# Patient Record
Sex: Female | Born: 1940 | Race: White | Hispanic: No | Marital: Married | Smoking: Never smoker
Health system: Southern US, Community
[De-identification: ages and names within clinical notes are randomized; demographics above are authoritative.]

## PROBLEM LIST (undated history)

## (undated) DIAGNOSIS — K219 Gastro-esophageal reflux disease without esophagitis: Secondary | ICD-10-CM

## (undated) DIAGNOSIS — M1990 Unspecified osteoarthritis, unspecified site: Secondary | ICD-10-CM

## (undated) DIAGNOSIS — E7800 Pure hypercholesterolemia, unspecified: Secondary | ICD-10-CM

## (undated) DIAGNOSIS — I10 Essential (primary) hypertension: Secondary | ICD-10-CM

## (undated) HISTORY — PX: EYE SURGERY: SHX253

## (undated) HISTORY — PX: OTHER SURGICAL HISTORY: SHX169

## (undated) HISTORY — PX: COLONOSCOPY: SHX174

## (undated) HISTORY — DX: Pure hypercholesterolemia, unspecified: E78.00

---

## 1998-02-18 ENCOUNTER — Other Ambulatory Visit: Admission: RE | Admit: 1998-02-18 | Discharge: 1998-02-18 | Payer: Self-pay | Admitting: Obstetrics & Gynecology

## 1999-03-18 ENCOUNTER — Other Ambulatory Visit: Admission: RE | Admit: 1999-03-18 | Discharge: 1999-03-18 | Payer: Self-pay | Admitting: Obstetrics & Gynecology

## 2000-04-12 ENCOUNTER — Other Ambulatory Visit: Admission: RE | Admit: 2000-04-12 | Discharge: 2000-04-12 | Payer: Self-pay | Admitting: Obstetrics & Gynecology

## 2001-09-20 ENCOUNTER — Other Ambulatory Visit: Admission: RE | Admit: 2001-09-20 | Discharge: 2001-09-20 | Payer: Self-pay | Admitting: Obstetrics & Gynecology

## 2003-04-16 ENCOUNTER — Other Ambulatory Visit: Admission: RE | Admit: 2003-04-16 | Discharge: 2003-04-16 | Payer: Self-pay | Admitting: Obstetrics & Gynecology

## 2004-04-27 ENCOUNTER — Other Ambulatory Visit: Admission: RE | Admit: 2004-04-27 | Discharge: 2004-04-27 | Payer: Self-pay | Admitting: Obstetrics & Gynecology

## 2005-08-26 ENCOUNTER — Ambulatory Visit: Payer: Self-pay | Admitting: Internal Medicine

## 2005-09-24 ENCOUNTER — Ambulatory Visit (HOSPITAL_COMMUNITY): Admission: RE | Admit: 2005-09-24 | Discharge: 2005-09-24 | Payer: Self-pay | Admitting: Internal Medicine

## 2005-09-24 ENCOUNTER — Ambulatory Visit: Payer: Self-pay | Admitting: Internal Medicine

## 2010-03-16 ENCOUNTER — Encounter (HOSPITAL_COMMUNITY)
Admission: RE | Admit: 2010-03-16 | Discharge: 2010-03-16 | Disposition: A | Discharge status: Home / Self Care | Payer: Medicare Other | Source: Ambulatory Visit | Attending: Neurosurgery | Admitting: Neurosurgery

## 2010-03-16 DIAGNOSIS — Q762 Congenital spondylolisthesis: Secondary | ICD-10-CM | POA: Insufficient documentation

## 2010-03-16 DIAGNOSIS — Z01812 Encounter for preprocedural laboratory examination: Secondary | ICD-10-CM | POA: Insufficient documentation

## 2010-03-16 LAB — CBC
HCT: 35.2 % — ABNORMAL LOW (ref 36.0–46.0)
Hemoglobin: 11.6 g/dL — ABNORMAL LOW (ref 12.0–15.0)
MCV: 94.1 fL (ref 78.0–100.0)
RDW: 12.1 % (ref 11.5–15.5)
WBC: 4.8 10*3/uL (ref 4.0–10.5)

## 2010-03-16 LAB — BASIC METABOLIC PANEL
BUN: 9 mg/dL (ref 6–23)
CO2: 30 mEq/L (ref 19–32)
GFR calc non Af Amer: >60 mL/min (ref >60)
Glucose, Bld: 120 mg/dL — ABNORMAL HIGH (ref 70–99)
Potassium: 4.9 mEq/L (ref 3.5–5.1)
Sodium: 142 mEq/L (ref 135–145)

## 2010-03-17 ENCOUNTER — Inpatient Hospital Stay (HOSPITAL_COMMUNITY)
Admission: RE | Admit: 2010-03-17 | Discharge: 2010-03-22 | DRG: 460 | Disposition: A | Discharge status: Home / Self Care | Payer: Medicare Other | Source: Ambulatory Visit | Attending: Neurosurgery | Admitting: Neurosurgery

## 2010-03-17 ENCOUNTER — Ambulatory Visit (HOSPITAL_COMMUNITY): Payer: Medicare Other

## 2010-03-17 DIAGNOSIS — K5900 Constipation, unspecified: Secondary | ICD-10-CM | POA: Diagnosis not present

## 2010-03-17 DIAGNOSIS — M48061 Spinal stenosis, lumbar region without neurogenic claudication: Secondary | ICD-10-CM

## 2010-03-17 DIAGNOSIS — Z01812 Encounter for preprocedural laboratory examination: Secondary | ICD-10-CM

## 2010-03-17 DIAGNOSIS — Q762 Congenital spondylolisthesis: Principal | ICD-10-CM

## 2010-03-17 DIAGNOSIS — M5116 Intervertebral disc disorders with radiculopathy, lumbar region: Secondary | ICD-10-CM

## 2010-03-25 NOTE — Op Note (Signed)
Lindsey Hardy, Lindsey Hardy               ACCOUNT NO.:  [id number]  MEDICAL RECORD NO.:  [id number]           PATIENT TYPE:  I  LOCATION:  3010                         FACILITY:  [facility]  PHYSICIAN:  Hilda Lias, M.D.   DATE OF BIRTH:  1940/07/02  DATE OF PROCEDURE: DATE OF DISCHARGE:                              OPERATIVE REPORT   ADMISSION DIAGNOSIS:  L4-L5 spondylolisthesis with chronic radiculopathy.  POSTOPERATIVE DIAGNOSES:  L4-L5 spondylolisthesis with chronic radiculopathy.  PROCEDURE:  L4 Gill procedure.  Bilateral 4-5 diskectomy and decompression of the L4-L5 nerve root.  Pedicle screws at L4-L5. Posterolateral arthrodesis at L4-L5 with Vitoss and autograft.  Cell Saver.  C-arm.  SURGEON:  Hilda Lias, M.D.  ASSISTANT:  Dr. Lovell Sheehan.  CLINICAL HISTORY:  [name] is a 70 year old female complaining of back pain, worsened to both legs, right worse than left one.  X-ray shows she has step-off at L4-5 and also calcification posterior in the dorsal aspect of the dura mater.  Flexion-extension shows spondylolisthesis grade 1.  Surgery was advised.  This patient and the husband knew about the risks of the surgery.  PROCEDURE:  The patient was taken to the OR, and she was positioned in prone manner.  The patient was quite hyperlordotic.  We took three x- rays just to be sure that we were in the right space.  From then on, midline incision was made through L4 down to the upper part of S1 and muscle retracted laterally.  We found the lower space and then we went up to the second space from below.  Another x-ray showed that indeed that was 4-5.  There was no question there was quite a bit of facet bilaterally which were loose.  The Gill procedure of removing the spinous process, the lamina, and the facet was done.  A thick yellow ligament was also excised, but in the right side the patient has quite a bit of calcification of the dura mater with displacement of the  thecal sac.  Lysis was accomplished, and we were able to free the dura matter. From then on, we entered the disk space, first in the left side and then in the right side.  A gross diskectomy was done with removal of the endplate.  Then, two cages of 10 x 22 were inserted with autograft inside.  Using the C-arm in [facility] and left lateral and lateral views, we probed the pedicle of L4-L5.  At the end, we were able to introduce four screws of 5 x 45.  Prior to insertion of the screws, we probed the holes just to be sure that they were surrounded by bone.  Once the screws were inserted, we felt the medial aspect of the screws at L4-L5, and there was no evidence of any perforation into the canal.  The screws were connected with rod and caps.  Then, the periosteum of the L4-L5 facet as well as the transverse process were drilled, and then a mix of Vitoss and autograft was used for arthrodesis.  Valsalva maneuver back again to 50 was negative. Then, the area was irrigated.  Nevertheless, we left some Tisseel  because I was concerned about the thickening of the ligament at the right side with thinning of the dura matter.  Then, the wound was closed with Vicryl and Steri-Strips.          ______________________________ Hilda Lias, M.D.     EB/MEDQ  D:  03/17/2010  T:  03/18/2010  Job:  [id number]  Electronically Signed by Hilda Lias M.D. on 03/25/2010 01:00:58 PM

## 2010-03-31 NOTE — Discharge Summary (Signed)
  NAMEFLOWER, FRANKO               ACCOUNT NO.:  [id number]  MEDICAL RECORD NO.:  [id number]           PATIENT TYPE:  I  LOCATION:  3010                         FACILITY:  [facility]  PHYSICIAN:  Clydene Fake, M.D.  DATE OF BIRTH:  12/20/1940  DATE OF ADMISSION:  03/17/2010 DATE OF DISCHARGE:  03/22/2010                              DISCHARGE SUMMARY   ADMISSION DIAGNOSIS:  L4-5 spondylolisthesis with radiculopathy.  DISCHARGE DIAGNOSIS:  L4-5 spondylolisthesis with radiculopathy.  PROCEDURE:  L4-5 decompressive laminectomy with a fusion with pedicle screw instrumentation.  REASON FOR ADMISSION:  The patient is a 70 year old woman with back and leg pain bilaterally, right worse than left.  The patient was brought in for decompression and fusion.  HOSPITAL COURSE:  The patient was admitted on the day of surgery, underwent procedure without complications.  Postop, the patient was transferred to the recovery room and to the floor and started increasing her activity.  She was supplied with brace.  PT and OT worked with the patient.  She continued increasing her activity and she has had less leg and back pain.  She did complain of some nauseousness with medication and some constipation.  Foley was removed, somewhat had trouble urinating that resolved.  She continued to increase her activity.  On March 22, 2010, she had no bowel movement, but positive flatus.  She was able to eat with less nauseousness.  She was given some laxatives. Her incision was clean, dry, and intact.  She was doing well.  We then discharged to her home in stable condition.  DISCHARGE MEDICATIONS: 1. Vicodin ES 1-2 q.4-6 hours p.r.n. 2. Compazine 5 mg q.4-6 hours p.r.n. 3. Flexeril 10 mg q.8 hours p.r.n. spasms. 4. Laxatives and stool softeners as needed.  Followup will be in 3-4 weeks in the office with Dr. Jeral Fruit.  No strenuous activity with brace.          ______________________________ Clydene Fake, M.D.     JRH/MEDQ  D:  03/22/2010  T:  03/23/2010  Job:  [id number]  Electronically Signed by Colon Branch M.D. on 03/31/2010 08:33:05 AM

## 2010-04-01 ENCOUNTER — Other Ambulatory Visit: Payer: Self-pay | Admitting: Neurosurgery

## 2010-04-01 DIAGNOSIS — M549 Dorsalgia, unspecified: Secondary | ICD-10-CM

## 2010-04-02 ENCOUNTER — Ambulatory Visit
Admission: RE | Admit: 2010-04-02 | Discharge: 2010-04-02 | Disposition: A | Discharge status: Home / Self Care | Payer: Medicare Other | Source: Ambulatory Visit | Attending: Neurosurgery | Admitting: Neurosurgery

## 2010-04-02 DIAGNOSIS — M549 Dorsalgia, unspecified: Secondary | ICD-10-CM

## 2010-04-03 ENCOUNTER — Inpatient Hospital Stay (HOSPITAL_COMMUNITY)
Admission: RE | Admit: 2010-04-03 | Discharge: 2010-04-07 | DRG: 030 | Disposition: A | Discharge status: Home / Self Care | Payer: Medicare Other | Source: Ambulatory Visit | Attending: Neurosurgery | Admitting: Neurosurgery

## 2010-04-03 DIAGNOSIS — Y92009 Unspecified place in unspecified non-institutional (private) residence as the place of occurrence of the external cause: Secondary | ICD-10-CM

## 2010-04-03 DIAGNOSIS — Z880 Allergy status to penicillin: Secondary | ICD-10-CM

## 2010-04-03 DIAGNOSIS — Y831 Surgical operation with implant of artificial internal device as the cause of abnormal reaction of the patient, or of later complication, without mention of misadventure at the time of the procedure: Secondary | ICD-10-CM | POA: Diagnosis present

## 2010-04-03 DIAGNOSIS — Z981 Arthrodesis status: Secondary | ICD-10-CM

## 2010-04-03 DIAGNOSIS — G988 Other disorders of nervous system: Principal | ICD-10-CM | POA: Diagnosis present

## 2010-04-03 LAB — CBC
HCT: 33.9 % — ABNORMAL LOW (ref 36.0–46.0)
Hemoglobin: 11.1 g/dL — ABNORMAL LOW (ref 12.0–15.0)
MCH: 30.5 pg (ref 26.0–34.0)
MCV: 93.1 fL (ref 78.0–100.0)
Platelets: 395 10*3/uL (ref 150–400)
RBC: 3.64 MIL/uL — ABNORMAL LOW (ref 3.87–5.11)
WBC: 7 10*3/uL (ref 4.0–10.5)

## 2010-04-21 NOTE — Op Note (Signed)
  NAMEKIMORI, TARTAGLIA               ACCOUNT NO.:  [id number]  MEDICAL RECORD NO.:  [id number]           PATIENT TYPE:  I  LOCATION:  3316                         FACILITY:  [facility]  PHYSICIAN:  Hilda Lias, M.D.   DATE OF BIRTH:  10/01/1940  DATE OF PROCEDURE:  04/03/2010 DATE OF DISCHARGE:                              OPERATIVE REPORT   PREOPERATIVE DIAGNOSIS:  Lumbar CSF leak, status post fusion at L4-L5.  POSTOPERATIVE DIAGNOSIS:  Lumbar CSF leak, status post fusion at L4-L5.  PROCEDURE:  Repair of a single opening on top of the L5 nerve root. Microscope.  SURGEON:  Hilda Lias, MD.  CLINICAL HISTORY:  Ms. Fiumara is a lady who three weeks ago underwent fusion at L4-L5.  The patient went home, has no problem, but off and on she had been having headache.  Sometimes days without headache, and lately for the past few days getting worse.  Yesterday, we did a myelogram and we found that there was a small leak at the level of the lumbar area.  Surgery was advised.  PROCEDURE:  The patient was taken to the OR and after intubation, she was positioned in prone manner.  The back was cleaned with DuraPrep.  We opened incision and indeed there was no evidence of any fluid in the subcutaneous space and only when we retracted laterally we found some fluid in the epidural space.  The area was irrigated.  We were unable to see any CSF leak, but we did Valsalva up to 40.  We found on top of the L5 nerve root there was a small opening which was taking care with a single stitch of 6-0 Prolene.  We investigated the canal, we investigated the foramen bilaterally as well as the posterior aspect of the spinal cord.  There was no more evidence of CSF leak.  We did a Valsalva 3 times.  The Valsalva was essentially negative.  Then, at the end the area was irrigated.  Tisseel was left in the dural space and the wound was closed with Vicryl and staples.           ______________________________ Hilda Lias, M.D.     EB/MEDQ  D:  04/03/2010  T:  04/04/2010  Job:  [id number]  Electronically Signed by Hilda Lias M.D. on 04/21/2010 05:46:26 PM

## 2010-04-21 NOTE — H&P (Signed)
  Lindsey, Hardy               ACCOUNT NO.:  [id number]  MEDICAL RECORD NO.:  [id number]           PATIENT TYPE:  I  LOCATION:  3316                         FACILITY:  [facility]  PHYSICIAN:  Hilda Lias, M.D.   DATE OF BIRTH:  10-Jun-1940  DATE OF ADMISSION:  04/03/2010 DATE OF DISCHARGE:                             HISTORY & PHYSICAL   Lindsey Hardy is a 70 year old female who about 2-1/2 weeks ago underwent fusion at the level of 4-5 because of spondylolisthesis.  The patient did well.  She was quite sensitive to her pain medication.  She developed quite a bit of nausea and vomiting.  Nevertheless, eventually she was discharged.  At home, she developed nausea and vomiting.  She was given some medication, but later on she developed a headache which was not constant.  There was some days where she had few headache, but there was some days where when she got off the bed the headache was pounding.  Nevertheless, I followed her by phone with her husband, and because of persistence of the headache __________ which showed small leak at the level of midline at 4-5.  Because of that, she is being admitted for surgery.  PAST MEDICAL HISTORY:  Lumbar fusion 4-5.  She is allergic to PENICILLIN.  SOCIAL HISTORY:  Negative.  FAMILY HISTORY:  Unremarkable.  PHYSICAL EXAMINATION:  HEAD, EARS, NOSE, AND THROAT:  Normal. NECK:  Normal. LUNGS:  Clear. HEAR:  Sounds normal. ABDOMEN:  Normal. EXTREMITIES:  Normal. NEUROLOGIC:  Completely normal.  Lumbar wound is completely dry.  There is no evidence of any CSF leak or any swelling.  IMPRESSION:  Lumbar cerebrospinal fluid leak, status post fusion 4-5.  RECOMMENDATIONS:  The patient being admitted for surgery.  We are going to explore the wound and repair the leak, which was shown in the myelogram.  Yesterday, I sat with her and her husband in my office, I told the procedure and all the possibility that she may require lumbar catheter.   She also knows that she is going to be at least 72 hours flat in bed.          ______________________________ Hilda Lias, M.D.     EB/MEDQ  D:  04/03/2010  T:  04/04/2010  Job:  [id number]  Electronically Signed by Hilda Lias M.D. on 04/21/2010 05:46:23 PM

## 2010-04-30 NOTE — Discharge Summary (Signed)
  Lindsey Hardy, Lindsey Hardy               ACCOUNT NO.:  [id number]  MEDICAL RECORD NO.:  [id number]           PATIENT TYPE:  I  LOCATION:  3316                         FACILITY:  [facility]  PHYSICIAN:  Coletta Memos, M.D.     DATE OF BIRTH:  1940-10-30  DATE OF ADMISSION:  04/03/2010 DATE OF DISCHARGE:  04/07/2010                              DISCHARGE SUMMARY   ADMITTING DIAGNOSIS:  Spinal fluid leak.  DISCHARGE DIAGNOSIS:  Spinal fluid leak.  PROCEDURE:  Primary repair spinal fluid leak.  COMPLICATIONS:  None.  DISCHARGE STATUS:  Alive and well.  Wound clean, flat, and dry.  No fluid seen.  She has no headache related to a posture.  Strength 5/5 in the upper and lower extremities.  [name] will be discharged home. No new meds need to be given as she has pain medication at home.  She will follow same instructions that she did the first discharge from her lumbar procedure.          ______________________________ Coletta Memos, M.D.     KC/MEDQ  D:  04/07/2010  T:  04/08/2010  Job:  [id number]  Electronically Signed by Coletta Memos M.D. on 04/30/2010 02:31:48 PM

## 2012-10-04 ENCOUNTER — Encounter (INDEPENDENT_AMBULATORY_CARE_PROVIDER_SITE_OTHER): Payer: Self-pay | Admitting: *Deleted

## 2012-10-26 ENCOUNTER — Ambulatory Visit (INDEPENDENT_AMBULATORY_CARE_PROVIDER_SITE_OTHER): Payer: 59 | Admitting: Internal Medicine

## 2012-10-26 ENCOUNTER — Encounter (INDEPENDENT_AMBULATORY_CARE_PROVIDER_SITE_OTHER): Payer: Self-pay | Admitting: Internal Medicine

## 2012-10-26 ENCOUNTER — Other Ambulatory Visit (INDEPENDENT_AMBULATORY_CARE_PROVIDER_SITE_OTHER): Payer: Self-pay | Admitting: *Deleted

## 2012-10-26 ENCOUNTER — Telehealth (INDEPENDENT_AMBULATORY_CARE_PROVIDER_SITE_OTHER): Payer: Self-pay | Admitting: *Deleted

## 2012-10-26 VITALS — BP 146/80 | HR 64 | Temp 98.0°F | Ht 62.0 in | Wt 117.8 lb

## 2012-10-26 DIAGNOSIS — R195 Other fecal abnormalities: Secondary | ICD-10-CM

## 2012-10-26 DIAGNOSIS — Z1211 Encounter for screening for malignant neoplasm of colon: Secondary | ICD-10-CM

## 2012-10-26 DIAGNOSIS — R159 Full incontinence of feces: Secondary | ICD-10-CM | POA: Insufficient documentation

## 2012-10-26 MED ORDER — PEG-KCL-NACL-NASULF-NA ASC-C 100 G PO SOLR: 1.0000 | Freq: Once | ORAL | Status: DC

## 2012-10-26 NOTE — Progress Notes (Signed)
Subjective:     Patient ID: Lindsey Hardy, female   DOB: Jun 19, 1940, 72 y.o.   MRN: [id number]  HPI  Referred to our office by Dr. Margo Common for fecal incontinence. She tells me symptoms started two years ago.    This past December, she had a BM. She bent over and then walked to the bedroom. She walked back to the bathroom and noticed a large amt of stool in the floor. She continues to have fecal incontinence in the morning when she gets up to go to the bathroom. This does not occur on a daily basis.  She has frequent flatus that she cannot control. She also tells me she has hemorrhoids and when she wipes she sees blood. Appetite is good. No weight loss. Occasionally lower abdominal pain after a BM. Her stools brown. Stools are sometimes normal, sometimes pencil thin and sometimes like pebbles.    Review of Systems see hpi Current Outpatient Prescriptions  Medication Sig Dispense Refill  . calcium citrate (CALCITRATE - DOSED IN MG ELEMENTAL CALCIUM) 950 MG tablet Take 1 tablet by mouth daily.      . cholecalciferol (VITAMIN D) 400 UNITS TABS tablet Take 1,000 Units by mouth.      . magnesium 30 MG tablet Take 250 mg by mouth 2 (two) times daily.      . multivitamin-iron-minerals-folic acid (CENTRUM) chewable tablet Chew 1 tablet by mouth daily.      . Omega-3 Krill Oil 300 MG CAPS Take by mouth.       No current facility-administered medications for this visit.   Past Medical History  Diagnosis Date  . High cholesterol    Allergies  Allergen Reactions  . Penicillins Itching, Swelling and Rash      Objective:   Physical Exam  Filed Vitals:   10/26/12 1444  BP: 146/80  Pulse: 64  Temp: 98 F (36.7 C)  Height: 5\' 2"  (1.575 m)  Weight: 117 lb 12.8 oz (53.434 kg)   Alert and oriented. Skin warm and dry. Oral mucosa is moist.   . Sclera anicteric, conjunctivae is pink. Thyroid not enlarged. No cervical lymphadenopathy. Lungs clear. Heart regular rate and rhythm.  Abdomen is soft.  Bowel sounds are positive. No hepatomegaly. No abdominal masses felt. No tenderness.   Stool brown and guaiac negative.      Assessment:    Change in stool and fecal incontinency. Colon neoplasm needs to be ruled out. Incontinence could be related to her past hx of back surgery.         Plan:    Colonoscopy. The risks and benefits such as perforation, bleeding, and infection were reviewed with the patient and is agreeable.The risks and benefits such as perforation, bleeding, and infection were reviewed with the patient and is agreeable.

## 2012-10-26 NOTE — Telephone Encounter (Signed)
Patient needs movi prep 

## 2012-10-26 NOTE — Patient Instructions (Addendum)
Colonoscopy with Dr. Rehman. The risks and benefits such as perforation, bleeding, and infection were reviewed with the patient and is agreeable. 

## 2012-10-31 ENCOUNTER — Encounter (HOSPITAL_COMMUNITY): Payer: Self-pay

## 2012-10-31 ENCOUNTER — Encounter (HOSPITAL_COMMUNITY)
Admission: RE | Disposition: A | Discharge status: Home / Self Care | Payer: Self-pay | Source: Ambulatory Visit | Attending: Internal Medicine

## 2012-10-31 ENCOUNTER — Ambulatory Visit (HOSPITAL_COMMUNITY)
Admission: RE | Admit: 2012-10-31 | Discharge: 2012-10-31 | Disposition: A | Discharge status: Home / Self Care | Payer: Medicare Other | Source: Ambulatory Visit | Attending: Internal Medicine | Admitting: Internal Medicine

## 2012-10-31 DIAGNOSIS — R198 Other specified symptoms and signs involving the digestive system and abdomen: Secondary | ICD-10-CM

## 2012-10-31 DIAGNOSIS — K5730 Diverticulosis of large intestine without perforation or abscess without bleeding: Secondary | ICD-10-CM

## 2012-10-31 DIAGNOSIS — R195 Other fecal abnormalities: Secondary | ICD-10-CM

## 2012-10-31 DIAGNOSIS — K921 Melena: Secondary | ICD-10-CM

## 2012-10-31 DIAGNOSIS — K644 Residual hemorrhoidal skin tags: Secondary | ICD-10-CM

## 2012-10-31 HISTORY — PX: COLONOSCOPY: SHX5424

## 2012-10-31 SURGERY — COLONOSCOPY
Anesthesia: Moderate Sedation

## 2012-10-31 MED ORDER — ALIGN 4 MG PO CAPS: 1.0000 | ORAL_CAPSULE | Freq: Every day | ORAL | Status: DC

## 2012-10-31 MED ORDER — STERILE WATER FOR IRRIGATION IR SOLN
Status: DC | PRN
  Administered 2012-10-31: 08:00:00

## 2012-10-31 MED ORDER — MEPERIDINE HCL 50 MG/ML IJ SOLN
INTRAMUSCULAR | Status: AC
  Filled 2012-10-31: qty 1

## 2012-10-31 MED ORDER — MEPERIDINE HCL 50 MG/ML IJ SOLN
INTRAMUSCULAR | Status: DC | PRN
  Administered 2012-10-31: 25 mg via INTRAVENOUS

## 2012-10-31 MED ORDER — MIDAZOLAM HCL 5 MG/5ML IJ SOLN
INTRAMUSCULAR | Status: DC | PRN
  Administered 2012-10-31: 1 mg via INTRAVENOUS
  Administered 2012-10-31 (×2): 2 mg via INTRAVENOUS

## 2012-10-31 MED ORDER — SODIUM CHLORIDE 0.9 % IV SOLN
INTRAVENOUS | Status: DC
  Administered 2012-10-31: 07:00:00 via INTRAVENOUS

## 2012-10-31 MED ORDER — PSYLLIUM 28 % PO PACK: 1.0000 | PACK | Freq: Every day | ORAL | Status: DC

## 2012-10-31 MED ORDER — MIDAZOLAM HCL 5 MG/5ML IJ SOLN
INTRAMUSCULAR | Status: AC
  Filled 2012-10-31: qty 10

## 2012-10-31 NOTE — H&P (Signed)
Lindsey Hardy is an 72 y.o. female.   Chief Complaint: Patient is here for colonoscopy. HPI: Patient is 72 year old Caucasian female who presents with a 2 month history of change in her bowel habits. She has urgency. She has accidents. At times she passes thin caliber stools. She also has intermittent hematochezia felt to be septic hemorrhoids. Her appetite is normal and she has not lost any weight. She denies numbness to lower extremities are perianal region. Patient's last colonoscopy was 7 years ago. Family history is negative for CRC.  Past Medical History  Diagnosis Date  . High cholesterol     Past Surgical History  Procedure Laterality Date  . Back fusion 2012 l4 and 5.    . Colonoscopy      2007 normal    History reviewed. No pertinent family history. Social History:  reports that she has never smoked. She does not have any smokeless tobacco history on file. She reports that  drinks alcohol. She reports that she does not use illicit drugs.  Allergies:  Allergies  Allergen Reactions  . Penicillins Itching, Swelling and Rash    Medications Prior to Admission  Medication Sig Dispense Refill  . calcium citrate (CALCITRATE - DOSED IN MG ELEMENTAL CALCIUM) 950 MG tablet Take 1 tablet by mouth daily.      . cholecalciferol (VITAMIN D) 400 UNITS TABS tablet Take 1,000 Units by mouth.      . magnesium 30 MG tablet Take 250 mg by mouth 2 (two) times daily.      . multivitamin-iron-minerals-folic acid (CENTRUM) chewable tablet Chew 1 tablet by mouth daily.      . Omega-3 Krill Oil 300 MG CAPS Take by mouth.      . peg 3350 powder (MOVIPREP) 100 G SOLR Take 1 kit (200 g total) by mouth once.  1 kit  0    No results found for this or any previous visit (from the past 48 hour(s)). No results found.  ROS  Blood pressure 146/76, pulse 78, temperature 97.9 F (36.6 C), temperature source Oral, resp. rate 14, height 5\' 2"  (1.575 m), weight 115 lb (52.164 kg), SpO2 98.00%. Physical  Exam  Constitutional:  Well-developed thin Caucasian female in NAD  HENT:  Mouth/Throat: Oropharynx is clear and moist.  Eyes: Conjunctivae are normal. No scleral icterus.  Neck: No thyromegaly present.  Cardiovascular: Normal rate, regular rhythm, normal heart sounds and intact distal pulses.   No murmur heard. Respiratory: Effort normal and breath sounds normal.  GI: Soft. She exhibits no distension and no mass. There is no tenderness.  Palpable aorta  Musculoskeletal: She exhibits no edema.  Lymphadenopathy:    She has no cervical adenopathy.  Neurological: She is alert.  Skin: Skin is warm and dry.     Assessment/Plan Change in bowel habits. Hematochezia. Diagnostic colonoscopy.  Deloy Archey U 10/31/2012, 7:55 AM

## 2012-10-31 NOTE — Op Note (Signed)
COLONOSCOPY PROCEDURE REPORT  PATIENT:  Lindsey Hardy  MR#:  [id number] Birthdate:  [birth year], 72 y.o., female Endoscopist:  Dr. Malissa Hippo, MD Referred By:  Dr. Oley Balm. Margo Common, MD. Procedure Date: 10/31/2012  Procedure:   Colonoscopy  Indications:  Patient is 72 year old Caucasian female who presents with change in caliber of her stools. She has urgency accidents and other times she has been caliber stools. She also has intermittent hematochezia felt to be second hemorrhoids. Patient's last colonoscopy was over 7 years ago. Family history is negative for CRC.  Informed Consent:  The procedure and risks were reviewed with the patient and informed consent was obtained.  Medications:  Demerol 25 mg IV Versed 5 mg IV  Description of procedure:  After a digital rectal exam was performed, that colonoscope was advanced from the anus through the rectum and colon to the area of the cecum, ileocecal valve and appendiceal orifice. The cecum was deeply intubated. These structures were well-seen and photographed for the record. From the level of the cecum and ileocecal valve, the scope was slowly and cautiously withdrawn. The mucosal surfaces were carefully surveyed utilizing scope tip to flexion to facilitate fold flattening as needed. The scope was pulled down into the rectum where a thorough exam including retroflexion was performed. Terminal ileum was also examined.  Findings:   Prep excellent. Normal mucosa of terminal ileum. Single diverticulum noted at ascending colon. No polyps or other mucosal abnormalities noted. Normal rectum mucosa. Hemorrhoids noted below the dentate line.   Therapeutic/Diagnostic Maneuvers Performed:  None  Complications:  None  Cecal Withdrawal Time:  9 minutes  Impression:  Normal mucosa of terminal. Single small diverticulum at ascending colon. External hemorrhoids. No evidence of colonic polyps or stricture. Suspect her symptoms are secondary to  IBS.  Recommendations:  Standard instructions given. High fiber diet. Metamucil 4 g by mouth each bedtime. Align one capsule by mouth daily. Stools diary until office visit in 8 weeks.  Lindsey Hardy U  10/31/2012 8:24 AM  CC: Dr. Oneita Hurt Not In System & Dr. No ref. provider found

## 2012-11-02 ENCOUNTER — Encounter (HOSPITAL_COMMUNITY): Payer: Self-pay | Admitting: Internal Medicine

## 2013-01-01 ENCOUNTER — Ambulatory Visit (INDEPENDENT_AMBULATORY_CARE_PROVIDER_SITE_OTHER): Payer: 59 | Admitting: Internal Medicine

## 2013-01-01 ENCOUNTER — Encounter (INDEPENDENT_AMBULATORY_CARE_PROVIDER_SITE_OTHER): Payer: Self-pay | Admitting: Internal Medicine

## 2013-01-01 VITALS — BP 112/54 | HR 72 | Temp 97.8°F | Ht 62.0 in | Wt 119.3 lb

## 2013-01-01 DIAGNOSIS — R159 Full incontinence of feces: Secondary | ICD-10-CM

## 2013-01-01 DIAGNOSIS — R195 Other fecal abnormalities: Secondary | ICD-10-CM

## 2013-01-01 NOTE — Progress Notes (Signed)
Subjective:     Patient ID: Lindsey Hardy, female   DOB: 07/30/1940, 72 y.o.   MRN: [id number]  HPI Here today for f/u after undergoing a colonoscopy in September for change in her stools.  She had fecal incontinence.  She tells me she is okay.   She continues to have small pellet like stools (3-4). Stools are soft. She does have to strain at times. She is taking the Metamucil daily.  She drinks water during the day. Appetite is good. No weight loss.  She had one episode of fecal incontinence pellet like. No melena or bright red rectal bleeding.   10/31/2012 Colonoscopy: Impression:  Normal mucosa of terminal.  Single small diverticulum at ascending colon.  External hemorrhoids.  No evidence of colonic polyps or stricture.  Suspect her symptoms are secondary to IBS.  Recommendations:  Standard instructions given.  High fiber diet.  Metamucil 4 g by mouth each bedtime.  Align one capsule by mouth daily.  Stools diary until office visit in 8 weeks.   Review of Systems     Current Outpatient Prescriptions  Medication Sig Dispense Refill  . calcium citrate (CALCITRATE - DOSED IN MG ELEMENTAL CALCIUM) 950 MG tablet Take 1 tablet by mouth daily.      . cholecalciferol (VITAMIN D) 400 UNITS TABS tablet Take 1,000 Units by mouth.      . magnesium 30 MG tablet Take 250 mg by mouth as needed.       . multivitamin-iron-minerals-folic acid (CENTRUM) chewable tablet Chew 1 tablet by mouth daily.      . Omega-3 Krill Oil 300 MG CAPS Take by mouth.      . Probiotic Product (ALIGN) 4 MG CAPS Take 1 capsule by mouth daily.      . psyllium (METAMUCIL SMOOTH TEXTURE) 28 % packet Take 1 packet by mouth at bedtime.       No current facility-administered medications for this visit.   Past Medical History  Diagnosis Date  . High cholesterol    Allergies  Allergen Reactions  . Penicillins Itching, Swelling and Rash   Past Surgical History  Procedure Laterality Date  . Back fusion 2012 l4 and  5.    . Colonoscopy      2007 normal  . Colonoscopy N/A 10/31/2012    Procedure: COLONOSCOPY;  Surgeon: Malissa Hippo, MD;  Location: [facility];  Service: Endoscopy;  Laterality: N/A;  730    Objective:   Physical Exam  Filed Vitals:   01/01/13 1427  BP: 112/54  Pulse: 72  Temp: 97.8 F (36.6 C)  Height: 5\' 2"  (1.575 m)  Weight: 119 lb 4.8 oz (54.114 kg)   Alert and oriented. Skin warm and dry. Oral mucosa is moist.   . Sclera anicteric, conjunctivae is pink. Thyroid not enlarged. No cervical lymphadenopathy. Lungs clear. Heart regular rate and rhythm.  Abdomen is soft. Bowel sounds are positive. No hepatomegaly. No abdominal masses felt. No tenderness.  No edema to lower extremities. Patient is alert and oriented.    Assessment:   Change in stools. Continues to have small pellet like BMs. One episode of fecal incontinence.    Plan:    Continue the Metamucil .  OV in one year.   Kegal exercises.

## 2013-01-01 NOTE — Patient Instructions (Signed)
Continue the Metamucil. Plenty of liquids. Kegal exercises

## 2013-09-26 ENCOUNTER — Encounter (INDEPENDENT_AMBULATORY_CARE_PROVIDER_SITE_OTHER): Payer: Self-pay | Admitting: *Deleted

## 2014-01-01 ENCOUNTER — Ambulatory Visit (INDEPENDENT_AMBULATORY_CARE_PROVIDER_SITE_OTHER): Payer: 59 | Admitting: Internal Medicine

## 2014-01-01 ENCOUNTER — Encounter (INDEPENDENT_AMBULATORY_CARE_PROVIDER_SITE_OTHER): Payer: Self-pay | Admitting: Internal Medicine

## 2014-01-01 VITALS — BP 118/66 | HR 68 | Temp 98.6°F | Resp 18 | Ht 62.0 in | Wt 119.7 lb

## 2014-01-01 DIAGNOSIS — K589 Irritable bowel syndrome without diarrhea: Secondary | ICD-10-CM

## 2014-01-01 MED ORDER — INULIN 1.5 G PO CHEW: 2.0000 | CHEWABLE_TABLET | Freq: Every day | ORAL | Status: DC

## 2014-01-01 NOTE — Progress Notes (Signed)
Presenting complaint;  Follow-up for bowel problems.  Subjective:  Patient is 73 year old Caucasian female who presents for scheduled visit. She was seen last year for change in bowel habits as well as fecal incontinence. She underwent colonoscopy in September 2014 revealing single diverticulum at ascending colon and external hemorrhoids. She was advised to take Metamucil and probiotic. She could not tolerate Metamucil. She remains on probiotic. She has not experienced any more episodes of fecal incontinence. Her bowels move daily. She states her stools look like pebbles clumped together. Occasionally she has to strain and when she has problem with hemorrhoids. She denies melena or rectal bleeding. She believes she eats enough fiber in her diet. Her appetite is normal and her weight has been stable. Recently she is had few episodes of dysphagia. She believes she did not chew her food well and maybe she was eating too fast. She denies heartburn.    Current Medications: Outpatient Encounter Prescriptions as of 01/01/2014  Medication Sig  . calcium citrate (CALCITRATE - DOSED IN MG ELEMENTAL CALCIUM) 950 MG tablet Take 1 tablet by mouth daily.  . cholecalciferol (VITAMIN D) 400 UNITS TABS tablet Take 1,000 Units by mouth.  . multivitamin-iron-minerals-folic acid (CENTRUM) chewable tablet Chew 1 tablet by mouth daily.  . Probiotic Product (ALIGN) 4 MG CAPS Take 1 capsule by mouth daily.  . Omega-3 Krill Oil 300 MG CAPS Take by mouth.  . [DISCONTINUED] magnesium 30 MG tablet Take 250 mg by mouth as needed.   . [DISCONTINUED] psyllium (METAMUCIL SMOOTH TEXTURE) 28 % packet Take 1 packet by mouth at bedtime. (Patient not taking: Reported on 01/01/2014)     Objective: Blood pressure 118/66, pulse 68, temperature 98.6 F (37 C), temperature source Oral, resp. rate 18, height 5\' 2"  (1.575 m), weight 119 lb 11.2 oz (54.296 kg). Patient is alert and in no acute distress. Conjunctiva is pink. Sclera  is nonicteric Oropharyngeal mucosa is normal. No neck masses or thyromegaly noted. Cardiac exam with regular rhythm normal S1 and S2. No murmur or gallop noted. Lungs are clear to auscultation. Abdomen is symmetrical. It is soft and nontender without organomegaly or masses. Rectal examination reveals soft simple skin tags. Sphincter tone is normal. Patient was able to increase sphincter tone and contract pelvic floor muscles when asked to do so. No LE edema or clubbing noted.    Assessment:  #1. 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 She does not have true fecal incontinence. She quit strengthen pelvic floor muscles by regular exercise which may prevent any such episodes in future. #2. Recent episodes of dysphagia. She does not have chronic heartburn or along symptoms.  Plan:  Can stop probiotic. Fiber choice 2 tablets daily. Patient will call if she has any more episodes of dysphagia. Office visit on as-needed basis. Next screening colonoscopy would be in 9 years.

## 2014-01-01 NOTE — Patient Instructions (Signed)
Can stop probiotic. Fiber choice chew 2 tablets daily for at least 2 months. Can stop if it does not help. Pelvic floor exercises 2-3 times a day as discussed.

## 2015-11-10 ENCOUNTER — Ambulatory Visit (INDEPENDENT_AMBULATORY_CARE_PROVIDER_SITE_OTHER): Payer: Medicare Other

## 2015-11-10 ENCOUNTER — Ambulatory Visit (INDEPENDENT_AMBULATORY_CARE_PROVIDER_SITE_OTHER): Payer: Medicare Other | Admitting: Orthopedic Surgery

## 2015-11-10 ENCOUNTER — Encounter: Payer: Self-pay | Admitting: Orthopedic Surgery

## 2015-11-10 VITALS — BP 171/91 | HR 86 | Wt 119.0 lb

## 2015-11-10 DIAGNOSIS — M25551 Pain in right hip: Secondary | ICD-10-CM

## 2015-11-10 NOTE — Progress Notes (Signed)
Chief Complaint  Patient presents with  . Follow-up    right hip pain   HPI  [age] year old female status post lumbar fusion in 2012 by Dr. Jeral FruitBotero presents with burning aching pain right buttock radiating to right thigh with catching especially after sitting associated with stiffness unrelieved by naproxen as Aleve. She complains of pain with weightbearing for at least 6 weeks.  Review of systems seasonal allergy lightheadedness limb pain. Denies any back pain  Prior surgery spinal fusion  Current medications Centrum calcium Krill oil and Colace Review of Systems  Musculoskeletal: Positive for joint pain.  Neurological: Positive for dizziness.  All other systems reviewed and are negative.  See above   Past Medical History:  Diagnosis Date  . High cholesterol     Past Surgical History:  Procedure Laterality Date  . Back fusion 2012 L4 and 5.    . COLONOSCOPY     2007 normal  . COLONOSCOPY N/A 10/31/2012   Procedure: COLONOSCOPY;  Surgeon: Malissa HippoNajeeb U Rehman, MD;  Location: [facility];  Service: Endoscopy;  Laterality: N/A;  730   No family history on file. Social History  Substance Use Topics  . Smoking status: Never Smoker  . Smokeless tobacco: Never Used  . Alcohol use 0.0 oz/week     Comment: glass wine occasionally   Current Meds  Medication Sig  . calcium citrate (CALCITRATE - DOSED IN MG ELEMENTAL CALCIUM) 950 MG tablet Take 1 tablet by mouth daily.  . cholecalciferol (VITAMIN D) 400 UNITS TABS tablet Take 1,000 Units by mouth.  . Inulin (FIBER CHOICE) 1.5 G CHEW Chew 2 tablets by mouth at bedtime.  . multivitamin-iron-minerals-folic acid (CENTRUM) chewable tablet Chew 1 tablet by mouth daily.  . Omega-3 Krill Oil 300 MG CAPS Take by mouth.    BP (!) 171/91   Pulse 86   Wt 119 lb (54 kg)   BMI 21.77 kg/m   Physical Exam  Constitutional: She is oriented to person, place, and time. She appears well-developed and well-nourished. No distress.  Cardiovascular:  Normal rate and intact distal pulses.   Neurological: She is alert and oriented to person, place, and time. She has normal reflexes. She exhibits normal muscle tone. Coordination normal.  Skin: Skin is warm and dry. No rash noted. She is not diaphoretic. No erythema. No pallor.  Psychiatric: She has a normal mood and affect. Her behavior is normal. Judgment and thought content normal.    Ortho Exam She walks normally.  She has normal range of motion in the hip she has mild discomfort in the hip with internal rotation.  She has tenderness in the right buttock which is the primary maximal point of tenderness. The hip is stable she has good hip flexor strength. I don't see any skin abnormalities in the right leg. Distally she has normal pulse no edema  The left ankle is without edema. Normal range of motion and equal range of motion in the left hip with normal strength and stability.  Greater trochanter right left nontender  ASSESSMENT: My personal interpretation of the images:  X-ray shows normal right hip    PLAN Return to know your surgeon for evaluation no hip pathology found  Fuller CanadaStanley Arlett Goold, MD 11/10/2015 9:35 AM  .meds

## 2015-12-10 ENCOUNTER — Other Ambulatory Visit: Payer: Self-pay | Admitting: Neurosurgery

## 2015-12-10 DIAGNOSIS — M4807 Spinal stenosis, lumbosacral region: Secondary | ICD-10-CM

## 2015-12-15 ENCOUNTER — Ambulatory Visit
Admission: RE | Admit: 2015-12-15 | Discharge: 2015-12-15 | Disposition: A | Discharge status: Home / Self Care | Payer: Medicare Other | Source: Ambulatory Visit | Attending: Neurosurgery | Admitting: Neurosurgery

## 2015-12-15 DIAGNOSIS — M4807 Spinal stenosis, lumbosacral region: Secondary | ICD-10-CM

## 2015-12-15 MED ORDER — METHYLPREDNISOLONE ACETATE 40 MG/ML INJ SUSP (RADIOLOG
120.0000 mg | Freq: Once | INTRAMUSCULAR | Status: AC
  Administered 2015-12-15: 120 mg via EPIDURAL

## 2015-12-15 MED ORDER — IOPAMIDOL (ISOVUE-M 200) INJECTION 41%
1.0000 mL | Freq: Once | INTRAMUSCULAR | Status: AC
  Administered 2015-12-15: 1 mL via EPIDURAL

## 2015-12-15 NOTE — Discharge Instructions (Signed)

## 2021-02-25 ENCOUNTER — Encounter (INDEPENDENT_AMBULATORY_CARE_PROVIDER_SITE_OTHER): Payer: Self-pay | Admitting: *Deleted

## 2021-04-21 ENCOUNTER — Ambulatory Visit (INDEPENDENT_AMBULATORY_CARE_PROVIDER_SITE_OTHER): Payer: Medicare Other | Admitting: Gastroenterology

## 2021-05-25 ENCOUNTER — Encounter (INDEPENDENT_AMBULATORY_CARE_PROVIDER_SITE_OTHER): Payer: Self-pay | Admitting: Gastroenterology

## 2021-05-25 ENCOUNTER — Ambulatory Visit (INDEPENDENT_AMBULATORY_CARE_PROVIDER_SITE_OTHER): Payer: Medicare PPO | Admitting: Gastroenterology

## 2021-05-25 DIAGNOSIS — R053 Chronic cough: Secondary | ICD-10-CM

## 2021-05-25 DIAGNOSIS — K581 Irritable bowel syndrome with constipation: Secondary | ICD-10-CM | POA: Diagnosis not present

## 2021-05-25 DIAGNOSIS — R6881 Early satiety: Secondary | ICD-10-CM | POA: Insufficient documentation

## 2021-05-25 DIAGNOSIS — R142 Eructation: Secondary | ICD-10-CM

## 2021-05-25 DIAGNOSIS — K589 Irritable bowel syndrome without diarrhea: Secondary | ICD-10-CM | POA: Insufficient documentation

## 2021-05-25 NOTE — Progress Notes (Signed)
[name], M.D. ?Gastroenterology & Hepatology ?[facility] ?[address] ?[place], [place] [address] ?Primary Care Physician: ?Leeanne Rio, MD ?[address] ?[place] [id number] ? ?Referring MD: PCP ? ?Chief Complaint:  burping ? ?History of Present Illness: ?Lindsey Hardy is a 81 y.o. female with PMH neuropathy, glaucoma, HTN, IBS-C, HLD, who presents for evaluation of burping, chronic cough, constipation. ? ?Patient states that she has presented recurrent episodes of burping a minute after having a meal or liquids. This has been happening for the last 2 months. She reports most of the burping is gas. She also feels an "irritation sensation in her throat" frequently. She does not have dysphagia but she reports that she "feels the food takes some time to go down". She has felt full easily recently after having a meal- in general she does not eat too much but lost some weight  (4 lb ) but not too much. ? ?She also reports a history of chronic cough, coughs on a daily basis. Has not seen pulmonology. Has had these symptoms for the last year. Coughing is not related to food intake or to episodes of heartburn. No improvement with PPI.  Denies any shortness of breath. ? ?She also reports having some episodes of pain in her lower abdomen around the Fall time. She reports that she took omeprazole in the Fall 2022 for 3 months. She also take Allign on and off. Has not felt the pain since the Fall. ? ?Also has a history of chronic constipation for multiple years - she has frequent tenesmus but has to strain to have a BM. Usually she has pebble like stool. She has a BM every 2-3 days. Does not take any laxatives. She has had a few episodes of fecal incontinence in the past which lasts for a few days. ? ?She is also concerned as on Saturday she had some "gurgling noises in her LUQ" with some "slushing of the sounds to the mid of her abdomen". States that she  also had pain going ot her mid abdomen.  ? ?The patient denies having any nausea, vomiting, fever, chills, hematochezia, melena, hematemesis,  diarrhea, jaundice, pruritus. ? ?Last JH:[id number] but no report available ? ?Last Colonoscopy:2014 ?Normal mucosa of terminal. ?Single small diverticulum at ascending colon. ?External hemorrhoids. ?No evidence of colonic polyps or stricture. ?Suspect her symptoms are secondary to IBS. ? ?FHx: neg for any gastrointestinal/liver disease, mother breast cancer ?Social: neg smoking, alcohol or illicit drug use ?Surgical: no abdominal surgeries ? ?Past Medical History: ?Past Medical History:  ?Diagnosis Date  ? High cholesterol   ? ? ?Past Surgical History: ?Past Surgical History:  ?Procedure Laterality Date  ? Back fusion 2012 L4 and 5.    ? COLONOSCOPY    ? 2007 normal  ? COLONOSCOPY N/A 10/31/2012  ? Procedure: COLONOSCOPY;  Surgeon: Rogene Houston, MD;  Location: [facility];  Service: Endoscopy;  Laterality: N/A;  730  ? ? ?Family History:History reviewed. No pertinent family history. ? ?Social History: ?Social History  ? ?Tobacco Use  ?Smoking Status Never  ?Smokeless Tobacco Never  ? ?Social History  ? ?Substance and Sexual Activity  ?Alcohol Use Yes  ? Alcohol/week: 0.0 standard drinks  ? Comment: glass wine occasionally  ? ?Social History  ? ?Substance and Sexual Activity  ?Drug Use No  ? ? ?Allergies: ?Allergies  ?Allergen Reactions  ? Penicillins Itching, Swelling and Rash  ? ? ?Medications: ?Current Outpatient  Medications  ?Medication Sig Dispense Refill  ? amLODipine (NORVASC) 5 MG tablet Take 5 mg by mouth daily.    ? calcium citrate (CALCITRATE - DOSED IN MG ELEMENTAL CALCIUM) 950 MG tablet Take 1 tablet by mouth daily. 600 mg BID    ? cyanocobalamin 1000 MCG tablet Take 1,000 mcg by mouth in the morning and at bedtime.    ? latanoprost (XALATAN) 0.005 % ophthalmic solution Place 1 drop into both eyes at bedtime.    ? magnesium 30 MG tablet Take 250 mg by mouth daily  at 6 (six) AM. 250 once per day.    ? Omega-3 Krill Oil 300 MG CAPS Take 500 mg by mouth daily at 6 (six) AM.    ? thiamine 100 MG tablet Take 100 mg by mouth daily.    ? ?No current facility-administered medications for this visit.  ? ? ?Review of Systems: ?GENERAL: negative for malaise, night sweats ?HEENT: No changes in hearing or vision, no nose bleeds or other nasal problems. ?NECK: Negative for lumps, goiter, pain and significant neck swelling ?RESPIRATORY: Negative for cough, wheezing ?CARDIOVASCULAR: Negative for chest pain, leg swelling, palpitations, orthopnea ?GI: SEE HPI ?MUSCULOSKELETAL: Negative for joint pain or swelling, back pain, and muscle pain. ?SKIN: Negative for lesions, rash ?PSYCH: Negative for sleep disturbance, mood disorder and recent psychosocial stressors. ?HEMATOLOGY Negative for prolonged bleeding, bruising easily, and swollen nodes. ?ENDOCRINE: Negative for cold or heat intolerance, polyuria, polydipsia and goiter. ?NEURO: negative for tremor, gait imbalance, syncope and seizures. ?The remainder of the review of systems is noncontributory. ? ? ?Physical Exam: ?BP (!) 149/73 (BP Location: Left Arm, Patient Position: Sitting, Cuff Size: Small)   Pulse 88   Temp 98.2 ?F (36.8 ?C) (Oral)   Ht 4\' 11"  (1.499 m)   Wt 111 lb 1.6 oz (50.4 kg)   BMI 22.44 kg/m?  ?GENERAL: The patient is AO x3, in no acute distress. ?HEENT: Head is normocephalic and atraumatic. EOMI are intact. Mouth is well hydrated and without lesions. ?NECK: Supple. No masses ?LUNGS: Clear to auscultation. No presence of rhonchi/wheezing/rales. Adequate chest expansion ?HEART: RRR, normal s1 and s2. ?ABDOMEN: Soft, nontender, no guarding, no peritoneal signs, and nondistended. BS +. No masses. ?EXTREMITIES: Without any cyanosis, clubbing, rash, lesions or edema. ?NEUROLOGIC: AOx3, no focal motor deficit. ?SKIN: no jaundice, no rashes ? ? ?Imaging/Labs: ?as above ? ?I personally reviewed and interpreted the available  labs, imaging and endoscopic files. ? ?Impression and Plan: ?Lindsey Hardy is a 81 y.o. female with PMH neuropathy, glaucoma, HTN, IBS-C, HLD, who presents for evaluation of burping, chronic cough, constipation.  She has presented today for complaints for which she comes today.  Regarding her episodes of burping and early satiety, which I consider we should evaluate further with an EGD.  I explained to her that frequent burping could be related to aerophagia which she will need to change some of her eating habits.  She may benefit from taking simethicone as needed to improve her symptoms.  It is unclear if she is presenting some concomitant dysphagia but we may proceed with possible dilation depending on findings and symptom persistence. ?In terms of her constipation, she has not been taking any medication for a regular basis to give her likely bowel regimen for which she was advised to start taking MiraLAX on a daily basis and uptitrate as needed.  We will check for reversible causes of constipation with blood work-up. ?Finally, she has presented chronic cough which I do  not consider is secondary to GERD as she did not have any response to PPI and has not presented any other typical symptoms for GERD.  I will refer her to evaluation by pulmonologist. ? ?-Check CMP and TSH ?-Start taking Miralax 1 capful every day for one week. If bowel movements do not improve, increase to 1 capful every 12 hours. If after two weeks there is no improvement, increase to 1 capful every 8 hours ?-Schedule EGD ?-Patient should chew food thoroughly and avoid talking when having meals ?-Referral to pulmonology ?-Can take Gas-X as needed for burping and bloating ? ?All questions were answered.     ? ?[name], MD ?Gastroenterology and Hepatology ?[facility] ? ?

## 2021-05-25 NOTE — Patient Instructions (Addendum)
Perform blood workup ?Start taking Miralax 1 capful every day for one week. If bowel movements do not improve, increase to 1 capful every 12 hours. If after two weeks there is no improvement, increase to 1 capful every 8 hours ?Schedule EGD ?Please chew food thoroughly and avoid talking when having your meals ?Referral to pulmonology ?Can take Gas-X as needed for burping and bloating ?

## 2021-05-26 LAB — TSH: TSH: 2.7 mIU/L (ref 0.40–4.50)

## 2021-05-26 LAB — COMPREHENSIVE METABOLIC PANEL
AG Ratio: 1.7 (calc) (ref 1.0–2.5)
ALT: 15 U/L (ref 6–29)
AST: 21 U/L (ref 10–35)
Albumin: 4.4 g/dL (ref 3.6–5.1)
Alkaline phosphatase (APISO): 60 U/L (ref 37–153)
BUN: 10 mg/dL (ref 7–25)
CO2: 31 mmol/L (ref 20–32)
Calcium: 9.6 mg/dL (ref 8.6–10.4)
Chloride: 103 mmol/L (ref 98–110)
Creat: 0.76 mg/dL (ref 0.60–1.00)
Globulin: 2.6 g/dL (calc) (ref 1.9–3.7)
Glucose, Bld: 98 mg/dL (ref 65–139)
Potassium: 4.5 mmol/L (ref 3.5–5.3)
Sodium: 140 mmol/L (ref 135–146)
Total Bilirubin: 0.6 mg/dL (ref 0.2–1.2)
Total Protein: 7 g/dL (ref 6.1–8.1)

## 2021-05-27 ENCOUNTER — Other Ambulatory Visit (INDEPENDENT_AMBULATORY_CARE_PROVIDER_SITE_OTHER): Payer: Self-pay

## 2021-05-28 ENCOUNTER — Encounter (INDEPENDENT_AMBULATORY_CARE_PROVIDER_SITE_OTHER): Payer: Self-pay

## 2021-06-23 ENCOUNTER — Ambulatory Visit (HOSPITAL_COMMUNITY)
Admission: RE | Admit: 2021-06-23 | Discharge: 2021-06-23 | Disposition: A | Discharge status: Home / Self Care | Payer: Medicare PPO | Source: Ambulatory Visit | Attending: Internal Medicine | Admitting: Internal Medicine

## 2021-06-23 ENCOUNTER — Ambulatory Visit: Payer: Medicare PPO | Admitting: Internal Medicine

## 2021-06-23 ENCOUNTER — Encounter: Payer: Self-pay | Admitting: Internal Medicine

## 2021-06-23 DIAGNOSIS — R053 Chronic cough: Secondary | ICD-10-CM | POA: Diagnosis not present

## 2021-06-23 MED ORDER — BENZONATATE 200 MG PO CAPS: 200.0000 mg | ORAL_CAPSULE | Freq: Three times a day (TID) | ORAL | 1 refills | Status: DC | PRN

## 2021-06-23 NOTE — Assessment & Plan Note (Addendum)
Onset fall 2021 assoc with dysphagia no better on gerd rx ?- 06/23/2021 rec gerd diet/ tessalon and complete GI w/u as planned and if not better refer to Dr Harriette Ohara vs trial of gabapentin  ? 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  ? ?These are the same pts (now being labeled as having "irritable larynx syndrome" by some cough centers) who not infrequently have a history of having failed to tolerate ace inhibitors,  dry powder inhalers or biphosphonates or report having atypical/extraesophageal reflux symptoms that don't respond to standard doses of PPI  and are easily confused as having aecopd or asthma flares by even experienced allergists/ pulmonologists (myself included).  ? ?Of the three most common causes of  Sub-acute / recurrent or chronic cough, only one (GERD)  can actually contribute to/ trigger  the other two (asthma and post nasal drip syndrome)  and perpetuate the cylce of cough. ? ?While not intuitively obvious, many patients with chronic low grade reflux do not cough until there is a primary insult that disturbs the protective epithelial barrier and exposes sensitive nerve endings.   This is typically viral but can due to PNDS and  either may apply here.   The point is that once this occurs, it is difficult to eliminate the cycle  using anything but a maximally effective acid suppression regimen at least in the short run, accompanied by an appropriate diet to address non acid GERD and control / eliminate the cough itself for at  least 7 days with tessalon 200 mg every 4-6 hours with the goal to eliminate even the urge to cough during this time.   ? ?Gabapentin can also be used for this purpose but since she's having such profound swallowing issues would prevent ent next possible Dr Delford Field at [facility] ? ?    ?  ? ?Each maintenance medication was reviewed in detail including emphasizing most importantly the difference between maintenance and prns and under what circumstances the prns are to be triggered using an action plan format where appropriate. ? ?Total time for H and P, chart review, counseling,  device(s) and generating customized AVS unique to this office visit / same day charting  > 45 min new pt eval ?     ? ? ?. ? ?

## 2021-06-23 NOTE — Patient Instructions (Addendum)
GERD (REFLUX)  is an extremely common cause of respiratory symptoms just like yours , many times with no obvious heartburn at all.  ? ? It can be treated with medication, but also with lifestyle changes including elevation of the head of your bed (ideally with 6-8inch blocks under the headboard of your bed),  Smoking cessation, avoidance of late meals, excessive alcohol, and avoid fatty foods, chocolate, peppermint, colas, red wine, and acidic juices such as orange juice.  ?NO MINT OR MENTHOL PRODUCTS SO NO COUGH DROPS  ?USE SUGARLESS CANDY INSTEAD (Jolley ranchers or Stover's or Life Savers) or even ice chips will also do - the key is to swallow to prevent all throat clearing. ?NO OIL BASED VITAMINS - use powdered substitutes.  Avoid fish oil when coughing.  ? ?For cough / urge to cough > tessalon 200 mg one every 4- 6 hours as needed  ? ?Please remember to go to the  x-ray department  @  [facility] for your tests - we will call you with the results when they are available    ? ? ? If you are satisfied with your treatment plan,  let your doctor know and he/she can either refill your medications or you can return here when your prescription runs out.   ? ? If in any way you are not 100% satisfied,  please tell us.  If 100% better, tell your friends! ? ?Pulmonary follow up is as needed   ?

## 2021-06-23 NOTE — Progress Notes (Signed)
? ?Lindsey Hardy, female    DOB: Jul 17, 1940   MRN: [id number] ? ? ?Brief patient profile:  ?17  yowf  never smoker  referred to pulmonary clinic in [place]  06/23/2021 by Dr Levon Hedger for cough x early fall 2021 and no better on gerd rx  for assoc dysphagia/globus/ hoarseness onset about the same time. ? ? ? ? ?History of Present Illness  ?06/23/2021  Pulmonary/ 1st office eval/ [facility] / [facility]  ?Chief Complaint  ?Patient presents with  ? Consult  ?  Chronic cough since fall of 2022. Hoarseness of voice   ?Dyspnea: sometimes with steps / not with adls, relatively sedentary  ?Cough: dry hack sometimes worse p drinking water never noct ?No worse with perfumes/ some worse with voice / laughing  ?Sleep: not typically waking with it on back on back one pillow ?SABA use: none ?Covid vax x 5 and never infected ? ?No obvious day to day or daytime variability or assoc excess/ purulent sputum or mucus plugs or hemoptysis or cp or chest tightness, subjective wheeze or overt sinus or hb symptoms.  ? ?Sleeping  without nocturnal  or early am exacerbation  of respiratory  c/o's or need for noct saba. Also denies any obvious fluctuation of symptoms with weather or environmental changes or other aggravating or alleviating factors except as outlined above  ? ?No unusual exposure hx or h/o childhood pna/ asthma or knowledge of premature birth. ? ?Current Allergies, Complete Past Medical History, Past Surgical History, Family History, and Social History were reviewed in [employer] record. ? ?ROS  The following are not active complaints unless bolded ?Hoarseness, sore throat, dysphagia, dental problems, itching, sneezing,  nasal congestion or discharge of excess mucus or purulent secretions, ear ache,   fever, chills, sweats, unintended wt loss or wt gain, classically pleuritic or exertional cp,  orthopnea pnd or arm/hand swelling  or leg swelling, presyncope, palpitations, abdominal pain/ lower  bilateral ? From coughing fits, anorexia, nausea, vomiting, diarrhea  or change in bowel habits or change in bladder habits, change in stools or change in urine, dysuria, hematuria,  rash, arthralgias, visual complaints, headache, numbness, weakness or ataxia or problems with walking or coordination,  change in mood or  memory. ?      ?   ? ? ? ?Past Medical History:  ?Diagnosis Date  ? High cholesterol   ? ? ?Outpatient Medications Prior to Visit  ?Medication Sig Dispense Refill  ? amLODipine (NORVASC) 5 MG tablet Take 5 mg by mouth daily.    ? calcium citrate (CALCITRATE - DOSED IN MG ELEMENTAL CALCIUM) 950 MG tablet Take 1 tablet by mouth daily. 600 mg BID    ? cyanocobalamin 1000 MCG tablet Take 1,000 mcg by mouth in the morning and at bedtime.    ? latanoprost (XALATAN) 0.005 % ophthalmic solution Place 1 drop into both eyes at bedtime.    ? magnesium 30 MG tablet Take 250 mg by mouth daily at 6 (six) AM. 250 once per day.    ? Omega-3 Krill Oil 300 MG CAPS Take 500 mg by mouth daily at 6 (six) AM.    ? thiamine 100 MG tablet Take 100 mg by mouth daily.    ? ?No facility-administered medications prior to visit.  ? ? ? ?Objective:  ?  ? ?BP 132/78 (BP Location: Left Arm, Patient Position: Sitting)   Pulse 82   Temp 98.7 ?F (37.1 ?C) (Temporal)   Ht 4\' 11"  (1.499  m)   Wt 111 lb (50.3 kg)   SpO2 98% Comment: ra  BMI 22.42 kg/m?  ? ?SpO2: 98 % (ra) ? ?Amb hoarse wf nad/ freq thoat clearing  ? ? ? HEENT : Oropharynx  pristine  Nasal turbintes nl  ? ? ?NECK :  without  appent JVD/ palpable Nodes/TM  ? ? ?LUNGS: no acc muscle use,  Nl contour chest which is clear to A and P bilaterally without cough on insp or exp maneuvers ? ? ?CV:  RRR  no s3 or murmur or increase in P2, and no edema  ? ?ABD:  soft and nontender with nl inspiratory excursion in the supine position. No bruits or organomegaly appreciated  ? ?MS:  Nl gait/ ext warm without deformities Or obvious joint restrictions  calf tenderness, cyanosis or  clubbing  ?  ? ?SKIN: warm and dry without lesions   ? ?NEURO:  alert, approp, nl sensorium with  no motor or cerebellar deficits apparent.  ? ? ?CXR PA and Lateral:   06/23/2021 :    ?I personally reviewed images and impression is as follows:     ?Mild/mod kyphosis / no acute findings  ? ? ?   ?Assessment  ? ?Chronic cough ?Onset fall 2021 assoc with dysphagia no better on gerd rx ?- 06/23/2021 rec gerd diet/ tessalon and complete GI w/u as planned and if not better refer to Dr Harriette OharaStephen Wright vs trial of gabapentin  ? 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  ? ?These are the same pts (now being labeled as having "irritable larynx syndrome" by some cough centers) who not infrequently have a history of having failed to tolerate ace inhibitors,  dry powder inhalers or biphosphonates or report having atypical/extraesophageal reflux symptoms that don't respond to standard doses of PPI  and are easily confused as having aecopd or asthma flares by even experienced allergists/ pulmonologists (myself included).  ? ?Of the three most common causes of  Sub-acute / recurrent or chronic cough, only one (GERD)  can actually contribute to/ trigger  the other two (asthma and post nasal drip syndrome)  and perpetuate the cylce of cough. ? ?While not intuitively obvious, many patients with chronic low grade reflux do not cough until there is a primary insult that disturbs the protective epithelial barrier and exposes sensitive nerve endings.   This is typically viral but  can due to PNDS and  either may apply here.   The point is that once this occurs, it is difficult to eliminate the cycle  using anything but a maximally effective acid suppression regimen at least in the short run, accompanied by an appropriate diet to address non acid GERD and control / eliminate the cough itself for at least 7 days with tessalon 200 mg every 4-6 hours with the goal to eliminate even the urge to cough during this time.   ? ?Gabapentin can also be used for this purpose but since she's having such profound swallowing issues would prevent ent next possible Dr  Delford Field at [facility] ? ?    ?  ? ?Each maintenance medication was reviewed in detail including emphasizing most importantly the difference between maintenance and prns and under what circumstances the prns are to be triggered using an action plan format where appropriate. ? ?Total time for H and P, chart review, counseling,  device(s) and generating customized AVS unique to this office visit / same day charting  > 45 min new pt eval ?     ? ? ?. ? ? ? ? ?Sandrea Hughs, MD ?06/23/2021 ?   ?

## 2021-06-24 NOTE — Patient Instructions (Signed)
? ? ? ? ? ? Lindsey Hardy ? 06/24/2021  ?  ? @PREFPERIOPPHARMACY @ ? ? Your procedure is scheduled on  06/30/2021. ? ? Report to 07/02/2021 at  0915  A.M. ? ? Call this number if you have problems the morning of surgery: ? [phone] ? ? Remember: ? Follow the diet instructions given to you by the office. ?  ? Take these medicines the morning of surgery with A SIP OF WATER   ? ?                                         amlodipine. ?  ? ? Do not wear jewelry, make-up or nail polish. ? Do not wear lotions, powders, or perfumes, or deodorant. ? Do not shave 48 hours prior to surgery.  Men may shave face and neck. ? Do not bring valuables to the hospital. ? [facility] is not responsible for any belongings or valuables. ? ?Contacts, dentures or bridgework may not be worn into surgery.  Leave your suitcase in the car.  After surgery it may be brought to your room. ? ?For patients admitted to the hospital, discharge time will be determined by your treatment team. ? ?Patients discharged the day of surgery will not be allowed to drive home and must have someone with them for 24 hours.  ? ? ?Special instructions:   DO NOT smoke tobacco or vape for 24 hours before your procedure. ? ?Please read over the following fact sheets that you were given. ?Anesthesia Post-op Instructions and Care and Recovery After Surgery ?  ? ? ? Upper Endoscopy, Adult, Care After ?This sheet gives you information about how to care for yourself after your procedure. Your health care provider may also give you more specific instructions. If you have problems or questions, contact your health care provider. ?What can I expect after the procedure? ?After the procedure, it is common to have: ?A sore throat. ?Mild stomach pain or discomfort. ?Bloating. ?Nausea. ?Follow these instructions at home: ? ?Follow instructions from your health care provider about what to eat or drink after your procedure. ?Return to your normal activities as told by your health  care provider. Ask your health care provider what activities are safe for you. ?Take over-the-counter and prescription medicines only as told by your health care provider. ?If you were given a sedative during the procedure, it can affect you for several hours. Do not drive or operate machinery until your health care provider says that it is safe. ?Keep all follow-up visits as told by your health care provider. This is important. ?Contact a health care provider if you have: ?A sore throat that lasts longer than one day. ?Trouble swallowing. ?Get help right away if: ?You vomit blood or your vomit looks like coffee grounds. ?You have: ?A fever. ?Bloody, black, or tarry stools. ?A severe sore throat or you cannot swallow. ?Difficulty breathing. ?Severe pain in your chest or abdomen. ?Summary ?After the procedure, it is common to have a sore throat, mild stomach discomfort, bloating, and nausea. ?If you were given a sedative during the procedure, it can affect you for several hours. Do not drive or operate machinery until your health care provider says that it is safe. ?Follow instructions from your health care provider about what to eat or drink after your procedure. ?Return to your normal activities as told by your  health care provider. ?This information is not intended to replace advice given to you by your health care provider. Make sure you discuss any questions you have with your health care provider. ?Document Revised: 12/01/2018 Document Reviewed: 06/27/2017 ?Elsevier Patient Education ? [place]. ?Monitored Anesthesia Care, Care After ?This sheet gives you information about how to care for yourself after your procedure. Your health care provider may also give you more specific instructions. If you have problems or questions, contact your health care provider. ?What can I expect after the procedure? ?After the procedure, it is common to have: ?Tiredness. ?Forgetfulness about what happened after the  procedure. ?Impaired judgment for important decisions. ?Nausea or vomiting. ?Some difficulty with balance. ?Follow these instructions at home: ?For the time period you were told by your health care provider: ? ?  ? ?Rest as needed. ?Do not participate in activities where you could fall or become injured. ?Do not drive or use machinery. ?Do not drink alcohol. ?Do not take sleeping pills or medicines that cause drowsiness. ?Do not make important decisions or sign legal documents. ?Do not take care of children on your own. ?Eating and drinking ?Follow the diet that is recommended by your health care provider. ?Drink enough fluid to keep your urine pale yellow. ?If you vomit: ?Drink water, juice, or soup when you can drink without vomiting. ?Make sure you have little or no nausea before eating solid foods. ?General instructions ?Have a responsible adult stay with you for the time you are told. It is important to have someone help care for you until you are awake and alert. ?Take over-the-counter and prescription medicines only as told by your health care provider. ?If you have sleep apnea, surgery and certain medicines can increase your risk for breathing problems. Follow instructions from your health care provider about wearing your sleep device: ?Anytime you are sleeping, including during daytime naps. ?While taking prescription pain medicines, sleeping medicines, or medicines that make you drowsy. ?Avoid smoking. ?Keep all follow-up visits as told by your health care provider. This is important. ?Contact a health care provider if: ?You keep feeling nauseous or you keep vomiting. ?You feel light-headed. ?You are still sleepy or having trouble with balance after 24 hours. ?You develop a rash. ?You have a fever. ?You have redness or swelling around the IV site. ?Get help right away if: ?You have trouble breathing. ?You have new-onset confusion at home. ?Summary ?For several hours after your procedure, you may feel  tired. You may also be forgetful and have poor judgment. ?Have a responsible adult stay with you for the time you are told. It is important to have someone help care for you until you are awake and alert. ?Rest as told. Do not drive or operate machinery. Do not drink alcohol or take sleeping pills. ?Get help right away if you have trouble breathing, or if you suddenly become confused. ?This information is not intended to replace advice given to you by your health care provider. Make sure you discuss any questions you have with your health care provider. ?Document Revised: 12/30/2020 Document Reviewed: 12/28/2018 ?Elsevier Patient Education ? [place]. ? ?

## 2021-06-26 ENCOUNTER — Other Ambulatory Visit: Payer: Self-pay

## 2021-06-26 ENCOUNTER — Encounter (HOSPITAL_COMMUNITY): Payer: Self-pay

## 2021-06-26 ENCOUNTER — Encounter (HOSPITAL_COMMUNITY)
Admission: RE | Admit: 2021-06-26 | Discharge: 2021-06-26 | Disposition: A | Discharge status: Home / Self Care | Payer: Medicare PPO | Source: Ambulatory Visit | Attending: Gastroenterology | Admitting: Gastroenterology

## 2021-06-26 VITALS — BP 144/75 | HR 77 | Temp 98.7°F | Resp 18 | Ht 59.0 in | Wt 111.0 lb

## 2021-06-26 DIAGNOSIS — Z01810 Encounter for preprocedural cardiovascular examination: Secondary | ICD-10-CM | POA: Diagnosis present

## 2021-06-26 DIAGNOSIS — I10 Essential (primary) hypertension: Secondary | ICD-10-CM

## 2021-06-26 HISTORY — DX: Gastro-esophageal reflux disease without esophagitis: K21.9

## 2021-06-26 HISTORY — DX: Unspecified osteoarthritis, unspecified site: M19.90

## 2021-06-26 HISTORY — DX: Essential (primary) hypertension: I10

## 2021-06-30 ENCOUNTER — Ambulatory Visit (HOSPITAL_COMMUNITY): Payer: Medicare PPO | Admitting: Anesthesiology

## 2021-06-30 ENCOUNTER — Ambulatory Visit (HOSPITAL_BASED_OUTPATIENT_CLINIC_OR_DEPARTMENT_OTHER): Payer: Medicare PPO | Admitting: Anesthesiology

## 2021-06-30 ENCOUNTER — Encounter (HOSPITAL_COMMUNITY): Payer: Self-pay | Admitting: Gastroenterology

## 2021-06-30 ENCOUNTER — Ambulatory Visit (HOSPITAL_COMMUNITY)
Admission: RE | Admit: 2021-06-30 | Discharge: 2021-06-30 | Disposition: A | Discharge status: Home / Self Care | Payer: Medicare PPO | Attending: Gastroenterology | Admitting: Gastroenterology

## 2021-06-30 ENCOUNTER — Encounter (HOSPITAL_COMMUNITY)
Admission: RE | Disposition: A | Discharge status: Home / Self Care | Payer: Self-pay | Source: Home / Self Care | Attending: Gastroenterology

## 2021-06-30 ENCOUNTER — Other Ambulatory Visit: Payer: Self-pay

## 2021-06-30 DIAGNOSIS — K922 Gastrointestinal hemorrhage, unspecified: Secondary | ICD-10-CM

## 2021-06-30 DIAGNOSIS — Z79899 Other long term (current) drug therapy: Secondary | ICD-10-CM | POA: Insufficient documentation

## 2021-06-30 DIAGNOSIS — K589 Irritable bowel syndrome without diarrhea: Secondary | ICD-10-CM

## 2021-06-30 DIAGNOSIS — M1990 Unspecified osteoarthritis, unspecified site: Secondary | ICD-10-CM | POA: Diagnosis not present

## 2021-06-30 DIAGNOSIS — K3189 Other diseases of stomach and duodenum: Secondary | ICD-10-CM | POA: Insufficient documentation

## 2021-06-30 DIAGNOSIS — I10 Essential (primary) hypertension: Secondary | ICD-10-CM | POA: Diagnosis not present

## 2021-06-30 DIAGNOSIS — K219 Gastro-esophageal reflux disease without esophagitis: Secondary | ICD-10-CM | POA: Insufficient documentation

## 2021-06-30 DIAGNOSIS — R6881 Early satiety: Secondary | ICD-10-CM

## 2021-06-30 DIAGNOSIS — R053 Chronic cough: Secondary | ICD-10-CM

## 2021-06-30 DIAGNOSIS — R142 Eructation: Secondary | ICD-10-CM

## 2021-06-30 HISTORY — PX: BIOPSY: SHX5522

## 2021-06-30 HISTORY — PX: ESOPHAGOGASTRODUODENOSCOPY (EGD) WITH PROPOFOL: SHX5813

## 2021-06-30 SURGERY — ESOPHAGOGASTRODUODENOSCOPY (EGD) WITH PROPOFOL
Anesthesia: General

## 2021-06-30 MED ORDER — EPHEDRINE SULFATE (PRESSORS) 50 MG/ML IJ SOLN
INTRAMUSCULAR | Status: DC | PRN
  Administered 2021-06-30: 20 mg via INTRAVENOUS

## 2021-06-30 MED ORDER — PROPOFOL 10 MG/ML IV BOLUS
INTRAVENOUS | Status: DC | PRN
  Administered 2021-06-30: 40 mg via INTRAVENOUS
  Administered 2021-06-30 (×2): 50 mg via INTRAVENOUS

## 2021-06-30 MED ORDER — LACTATED RINGERS IV SOLN: INTRAVENOUS | Status: DC

## 2021-06-30 MED ORDER — LIDOCAINE HCL 1 % IJ SOLN
INTRAMUSCULAR | Status: DC | PRN
  Administered 2021-06-30: 50 mg via INTRADERMAL

## 2021-06-30 MED ORDER — OMEPRAZOLE 40 MG PO CPDR: 40.0000 mg | DELAYED_RELEASE_CAPSULE | Freq: Every day | ORAL | 3 refills | Status: DC

## 2021-06-30 NOTE — Discharge Instructions (Addendum)
You are being discharged to home.  Resume your previous diet.  We are waiting for your pathology results.  Continue your present medications.  Start omeprazole 40 mg qday

## 2021-06-30 NOTE — Anesthesia Postprocedure Evaluation (Signed)
Anesthesia Post Note  Patient: Lindsey Hardy  Procedure(s) Performed: ESOPHAGOGASTRODUODENOSCOPY (EGD) WITH PROPOFOL BIOPSY  Patient location during evaluation: Short Stay Anesthesia Type: General Level of consciousness: awake and alert Pain management: pain level controlled Vital Signs Assessment: post-procedure vital signs reviewed and stable Respiratory status: spontaneous breathing Cardiovascular status: blood pressure returned to baseline and stable Postop Assessment: no apparent nausea or vomiting Anesthetic complications: no   No notable events documented.   Last Vitals:  Vitals:   06/30/21 0921  BP: (!) 145/72  Pulse: 80  Resp: 13  Temp: 36.6 C  SpO2: 100%    Last Pain:  Vitals:   06/30/21 1116  TempSrc:   PainSc: 0-No pain                 Estell Puccini

## 2021-06-30 NOTE — H&P (Signed)
Lindsey Hardy is an 81 y.o. female.   Chief Complaint: burping and dysphagia HPI: Lindsey Hardy is a 81 y.o. female with PMH neuropathy, glaucoma, HTN, IBS-C, HLD, who presents for evaluation of burping and dysphagia.  Has presented recurrent episodes of burping after meals which has not improved with the use of simethicone.  Has lost some weight as she feels full easily.  Denies any nausea or vomiting.  Has occasional dysphagia episodes.   Past Medical History:  Diagnosis Date   Arthritis    GERD (gastroesophageal reflux disease)    High cholesterol    Hypertension     Past Surgical History:  Procedure Laterality Date   Back fusion 2012 L4 and 5.     COLONOSCOPY     2007 normal   COLONOSCOPY N/A 10/31/2012   Procedure: COLONOSCOPY;  Surgeon: Rogene Houston, MD;  Location: [facility];  Service: Endoscopy;  Laterality: N/A;  730   EYE SURGERY Left    cataract removal    History reviewed. No pertinent family history. Social History:  reports that she has never smoked. She has never used smokeless tobacco. She reports current alcohol use. She reports that she does not use drugs.  Allergies:  Allergies  Allergen Reactions   Penicillins Itching, Swelling and Rash    Medications Prior to Admission  Medication Sig Dispense Refill   amLODipine (NORVASC) 5 MG tablet Take 5 mg by mouth daily.     Calcium Carbonate-Vitamin D (CALCIUM 600+D PO) Take 1 tablet by mouth in the morning and at bedtime.     cyanocobalamin 1000 MCG tablet Take 1,000 mcg by mouth in the morning and at bedtime.     latanoprost (XALATAN) 0.005 % ophthalmic solution Place 1 drop into both eyes at bedtime.     Magnesium 250 MG TABS Take 250 mg by mouth daily at 6 (six) AM.     thiamine (VITAMIN B-1) 100 MG tablet Take 100 mg by mouth daily.     benzonatate (TESSALON) 200 MG capsule Take 1 capsule (200 mg total) by mouth 3 (three) times daily as needed for cough. 40 capsule 1   Omega-3 Krill Oil 500 MG  CAPS Take 500 mg by mouth daily at 6 (six) AM. (Patient not taking: Reported on 06/23/2021)      No results found for this or any previous visit (from the past 48 hour(s)). No results found.  Review of Systems  Constitutional: Negative.   HENT: Negative.    Eyes: Negative.   Respiratory: Negative.    Cardiovascular: Negative.   Gastrointestinal: Negative.   Endocrine: Negative.   Genitourinary: Negative.   Musculoskeletal: Negative.   Skin: Negative.   Allergic/Immunologic: Negative.   Neurological: Negative.   Hematological: Negative.   Psychiatric/Behavioral: Negative.     Blood pressure (!) 145/72, pulse 80, temperature 97.8 F (36.6 C), temperature source Oral, resp. rate 13, height 4\' 11"  (1.499 m), weight 50.3 kg, SpO2 100 %. Physical Exam  GENERAL: The patient is AO x3, in no acute distress. HEENT: Head is normocephalic and atraumatic. EOMI are intact. Mouth is well hydrated and without lesions. NECK: Supple. No masses LUNGS: Clear to auscultation. No presence of rhonchi/wheezing/rales. Adequate chest expansion HEART: RRR, normal s1 and s2. ABDOMEN: Soft, nontender, no guarding, no peritoneal signs, and nondistended. BS +. No masses. EXTREMITIES: Without any cyanosis, clubbing, rash, lesions or edema. NEUROLOGIC: AOx3, no focal motor deficit. SKIN: no jaundice, no rashes  Assessment/Plan JADE KOLBERG is a  81 y.o. female with PMH neuropathy, glaucoma, HTN, IBS-C, HLD, who presents for evaluation of burping and dysphagia.  We will proceed with EGD.  Lindsey Quale, MD 06/30/2021, 9:39 AM

## 2021-06-30 NOTE — Op Note (Addendum)
[facility] Patient Name: Lindsey Hardy Procedure Date: 06/30/2021 10:55 AM MRN: [id number] Date of Birth: [birth year] Attending MD: [name] ,  CSN: [id number] Age: 81 Admit Type: Outpatient Procedure:                Upper GI endoscopy Indications:              Early satiety, Burping Providers:                [name], Buel Ream. Thomasena Edis RN, RN, Pandora Leiter, Technician Referring MD:              Medicines:                Monitored Anesthesia Care Complications:            No immediate complications. Estimated Blood Loss:     Estimated blood loss: none. Procedure:                Pre-Anesthesia Assessment:                           - Prior to the procedure, a History and Physical                            was performed, and patient medications, allergies                            and sensitivities were reviewed. The patient's                            tolerance of previous anesthesia was reviewed.                           - The risks and benefits of the procedure and the                            sedation options and risks were discussed with the                            patient. All questions were answered and informed                            consent was obtained.                           - ASA Grade Assessment: II - A patient with mild                            systemic disease.                           After obtaining informed consent, the endoscope was                            passed under direct vision. Throughout the  procedure, the patient's blood pressure, pulse, and                            oxygen saturations were monitored continuously. The                            [id number]-[id number] ([id number] scope was introduced through the                            mouth, and advanced to the second part of duodenum.                            The upper GI endoscopy was accomplished without                             difficulty. The patient tolerated the procedure                            well. Scope In: 11:31:38 AM Scope Out: 11:34:51 AM Total Procedure Duration: 0 hours 3 minutes 13 seconds  Findings:      The examined esophagus was normal.      A few localized small erosions with stigmata of recent bleeding were       found in the gastric body. Biopsies were taken with a cold forceps for       Helicobacter pylori testing.      The examined duodenum was normal. Impression:               - Normal esophagus.                           - Erosive gastropathy with stigmata of recent                            bleeding. Biopsied.                           - Normal examined duodenum. Moderate Sedation:      Per Anesthesia Care Recommendation:           - Discharge patient to home (ambulatory).                           - Resume previous diet.                           - Await pathology results.                           - Continue present medications.                           -Start omeprazole 40 mg qday Procedure Code(s):        --- Professional ---                           [id number], Esophagogastroduodenoscopy, flexible,  transoral; with biopsy, single or multiple Diagnosis Code(s):        --- Professional ---                           K92.2, Gastrointestinal hemorrhage, unspecified                           R68.81, Early satiety CPT copyright 2019 American Medical Association. All rights reserved. The codes documented in this report are preliminary and upon coder review may  be revised to meet current compliance requirements. [name], MD [name],  06/30/2021 11:39:53 AM This report has been signed electronically. Number of Addenda: 0

## 2021-06-30 NOTE — Transfer of Care (Signed)
Immediate Anesthesia Transfer of Care Note  Patient: Lindsey Hardy  Procedure(s) Performed: ESOPHAGOGASTRODUODENOSCOPY (EGD) WITH PROPOFOL BIOPSY  Patient Location: Short Stay  Anesthesia Type:General  Level of Consciousness: awake  Airway & Oxygen Therapy: Patient Spontanous Breathing  Post-op Assessment: Report given to RN  Post vital signs: Reviewed and stable  Last Vitals:  Vitals Value Taken Time  BP    Temp    Pulse    Resp    SpO2      Last Pain:  Vitals:   06/30/21 1116  TempSrc:   PainSc: 0-No pain         Complications: No notable events documented.

## 2021-06-30 NOTE — Anesthesia Preprocedure Evaluation (Signed)
Anesthesia Evaluation  Patient identified by MRN, date of birth, ID band Patient awake    Reviewed: Allergy & Precautions, NPO status , Patient's Chart, lab work & pertinent test results  History of Anesthesia Complications Negative for: history of anesthetic complications  Airway Mallampati: II  TM Distance: >3 FB Neck ROM: Full    Dental  (+) Dental Advisory Given, Teeth Intact   Pulmonary neg pulmonary ROS,    Pulmonary exam normal breath sounds clear to auscultation       Cardiovascular hypertension, Pt. on medications Normal cardiovascular exam Rhythm:Regular Rate:Normal     Neuro/Psych negative neurological ROS  negative psych ROS   GI/Hepatic Neg liver ROS, GERD  Medicated,  Endo/Other  negative endocrine ROS  Renal/GU negative Renal ROS  negative genitourinary   Musculoskeletal  (+) Arthritis , Osteoarthritis,    Abdominal   Peds negative pediatric ROS (+)  Hematology negative hematology ROS (+)   Anesthesia Other Findings   Reproductive/Obstetrics negative OB ROS                             Anesthesia Physical Anesthesia Plan  ASA: 2  Anesthesia Plan: General   Post-op Pain Management: Minimal or no pain anticipated   Induction: Intravenous  PONV Risk Score and Plan: Propofol infusion  Airway Management Planned: Nasal Cannula and Natural Airway  Additional Equipment:   Intra-op Plan:   Post-operative Plan:   Informed Consent: I have reviewed the patients History and Physical, chart, labs and discussed the procedure including the risks, benefits and alternatives for the proposed anesthesia with the patient or authorized representative who has indicated his/her understanding and acceptance.     Dental advisory given  Plan Discussed with: CRNA and Surgeon  Anesthesia Plan Comments:         Anesthesia Quick Evaluation

## 2021-07-01 LAB — SURGICAL PATHOLOGY

## 2021-07-07 ENCOUNTER — Encounter (HOSPITAL_COMMUNITY): Payer: Self-pay | Admitting: Gastroenterology

## 2021-08-24 ENCOUNTER — Encounter (INDEPENDENT_AMBULATORY_CARE_PROVIDER_SITE_OTHER): Payer: Self-pay | Admitting: Gastroenterology

## 2021-08-24 ENCOUNTER — Ambulatory Visit (INDEPENDENT_AMBULATORY_CARE_PROVIDER_SITE_OTHER): Payer: Medicare PPO | Admitting: Gastroenterology

## 2021-08-24 VITALS — BP 139/75 | HR 76 | Temp 97.6°F | Ht 59.0 in | Wt 110.5 lb

## 2021-08-24 DIAGNOSIS — R142 Eructation: Secondary | ICD-10-CM | POA: Diagnosis not present

## 2021-08-24 DIAGNOSIS — K2970 Gastritis, unspecified, without bleeding: Secondary | ICD-10-CM | POA: Diagnosis not present

## 2021-08-24 DIAGNOSIS — K581 Irritable bowel syndrome with constipation: Secondary | ICD-10-CM

## 2021-08-24 DIAGNOSIS — K2990 Gastroduodenitis, unspecified, without bleeding: Secondary | ICD-10-CM | POA: Diagnosis not present

## 2021-08-24 NOTE — Patient Instructions (Addendum)
Continue omeprazole 40 mg qday Continue Miralax daily Practice diaphragmatic breathing

## 2021-08-24 NOTE — Progress Notes (Signed)
Lindsey Hardy, M.D. Gastroenterology & Hepatology [facility] [address] [place], [place] [address]  Primary Care Physician: Suzan Slick, MD [address] [place] [address]  I will communicate my assessment and recommendations to the referring MD via EMR.  Problems: Constipation Burping  History of Present Illness: Lindsey Hardy is a 81 y.o. female with PMH neuropathy, glaucoma, HTN, IBS-C, HLD, who presents for evaluation of burping and constipation.  The patient was last seen on 05/25/2021. At that time, the patient had her CMP and TSH checked which where within normal limits.  She was advised to start taking MiraLAX every day.  She was referred to pulmonology for further evaluation of her cough episodes.  I advised her to implement the use of Gas-X for burping episodes.  She also underwent an EGD for further evaluation of her symptoms.  EGD was performed on 06/30/2021 which showed presence of few erosions in her stomach with negative biopsies for H. pylori or dysplasia.  Normal esophagus and duodenum.  She was prescribed omeprazole 40 mg every day since then.  Patient reports that she has felt better after starting omeprazole 40 mg qday, as she has felt she has been having more appetite and the early satiety. States burping has improved as well but is not doing diaphragmatic breathing yet.  States she is having a BM regularly as long as she is taking Miralax.  The patient denies having any nausea, vomiting, fever, chills, hematochezia, melena, hematemesis, abdominal distention, abdominal pain, diarrhea, jaundice, pruritus or weight loss.  Last EGD: As above   Last Colonoscopy:2014 Normal mucosa of terminal. Single small diverticulum at ascending colon. External hemorrhoids. No evidence of colonic polyps or stricture. Suspect her symptoms are secondary to IBS.  Past Medical History: Past Medical History:   Diagnosis Date   Arthritis    GERD (gastroesophageal reflux disease)    High cholesterol    Hypertension     Past Surgical History: Past Surgical History:  Procedure Laterality Date   Back fusion 2012 L4 and 5.     BIOPSY  06/30/2021   Procedure: BIOPSY;  Surgeon: Dolores Frame, MD;  [facility];  Service: Gastroenterology;;   COLONOSCOPY     2007 normal   COLONOSCOPY N/A 10/31/2012   Procedure: COLONOSCOPY;  Surgeon: Malissa Hippo, MD;  [facility];  Service: Endoscopy;  Laterality: N/A;  730   ESOPHAGOGASTRODUODENOSCOPY (EGD) WITH PROPOFOL N/A 06/30/2021   Procedure: ESOPHAGOGASTRODUODENOSCOPY (EGD) WITH PROPOFOL;  Surgeon: Dolores Frame, MD;  [facility];  Service: Gastroenterology;  Laterality: N/A;  1100   EYE SURGERY Left    cataract removal    Family History:History reviewed. No pertinent family history.  Social History: Social History   Tobacco Use  Smoking Status Never  Smokeless Tobacco Never   Social History   Substance and Sexual Activity  Alcohol Use Yes   Alcohol/week: 0.0 standard drinks of alcohol   Comment: glass wine occasionally   Social History   Substance and Sexual Activity  Drug Use No    Allergies: Allergies  Allergen Reactions   Penicillins Itching, Swelling and Rash    Medications: Current Outpatient Medications  Medication Sig Dispense Refill   amLODipine (NORVASC) 5 MG tablet Take 5 mg by mouth daily.     Calcium Carbonate-Vitamin D (CALCIUM 600+D PO) Take 1 tablet by mouth in the morning and at bedtime.     cyanocobalamin  1000 MCG tablet Take 1,000 mcg by mouth in the morning and at bedtime.     latanoprost (XALATAN) 0.005 % ophthalmic solution Place 1 drop into both eyes at bedtime.     Magnesium 250 MG TABS Take 250 mg by mouth daily at 6 (six) AM.     omeprazole (PRILOSEC) 40 MG capsule Take 1 capsule (40 mg total) by mouth daily. 90 capsule 3   thiamine (VITAMIN  B-1) 100 MG tablet Take 100 mg by mouth daily.     No current facility-administered medications for this visit.    Review of Systems: GENERAL: negative for malaise, night sweats HEENT: No changes in hearing or vision, no nose bleeds or other nasal problems. NECK: Negative for lumps, goiter, pain and significant neck swelling RESPIRATORY: Negative for cough, wheezing CARDIOVASCULAR: Negative for chest pain, leg swelling, palpitations, orthopnea GI: SEE HPI MUSCULOSKELETAL: Negative for joint pain or swelling, back pain, and muscle pain. SKIN: Negative for lesions, rash PSYCH: Negative for sleep disturbance, mood disorder and recent psychosocial stressors. HEMATOLOGY Negative for prolonged bleeding, bruising easily, and swollen nodes. ENDOCRINE: Negative for cold or heat intolerance, polyuria, polydipsia and goiter. NEURO: negative for tremor, gait imbalance, syncope and seizures. The remainder of the review of systems is noncontributory.   Physical Exam: BP 139/75 (BP Location: Left Arm, Patient Position: Sitting, Cuff Size: Small)   Pulse 76   Temp 97.6 F (36.4 C) (Oral)   Ht 4\' 11"  (1.499 m)   Wt 110 lb 8 oz (50.1 kg)   BMI 22.32 kg/m  GENERAL: The patient is AO x3, in no acute distress. HEENT: Head is normocephalic and atraumatic. EOMI are intact. Mouth is well hydrated and without lesions. NECK: Supple. No masses LUNGS: Clear to auscultation. No presence of rhonchi/wheezing/rales. Adequate chest expansion HEART: RRR, normal s1 and s2. ABDOMEN: Soft, nontender, no guarding, no peritoneal signs, and nondistended. BS +. No masses. EXTREMITIES: Without any cyanosis, clubbing, rash, lesions or edema. NEUROLOGIC: AOx3, no focal motor deficit. SKIN: no jaundice, no rashes  Imaging/Labs: as above  I personally reviewed and interpreted the available labs, imaging and endoscopic files.  Impression and Plan: Lindsey Hardy is a 81 y.o. female with PMH neuropathy, glaucoma,  HTN, IBS-C, HLD, who presents for evaluation of burping and constipation.  The patient underwent a recent EGD to evaluate her burping and early satiety, she was found to have presence of mild erosive gastritis changes but no other abnormalities.  She has presented major improvement of her symptoms while taking omeprazole 40 mg every day which I consider she should continue taking.  We can consider decreasing her dose to 20 mg in her follow-up appointment.  She may also benefit from implementing diaphragmatic breathing as part of the strategies to alleviate her symptoms further.  Finally she has presented improvement of her constipation with MiraLAX, she should continue taking this medication on a daily basis.  - Continue omeprazole 40 mg qday - Continue Miralax daily - Practice diaphragmatic breathing  All questions were answered.      72, MD Gastroenterology and Hepatology [facility]

## 2022-06-30 ENCOUNTER — Encounter (INDEPENDENT_AMBULATORY_CARE_PROVIDER_SITE_OTHER): Payer: Self-pay | Admitting: Gastroenterology

## 2022-08-26 ENCOUNTER — Ambulatory Visit (INDEPENDENT_AMBULATORY_CARE_PROVIDER_SITE_OTHER): Payer: Medicare PPO | Admitting: Gastroenterology

## 2022-11-01 ENCOUNTER — Ambulatory Visit (INDEPENDENT_AMBULATORY_CARE_PROVIDER_SITE_OTHER): Payer: Medicare PPO | Admitting: Gastroenterology

## 2022-11-01 ENCOUNTER — Encounter (INDEPENDENT_AMBULATORY_CARE_PROVIDER_SITE_OTHER): Payer: Self-pay | Admitting: Gastroenterology

## 2022-11-01 VITALS — BP 153/77 | HR 81 | Temp 98.0°F | Ht 59.0 in | Wt 112.5 lb

## 2022-11-01 DIAGNOSIS — K581 Irritable bowel syndrome with constipation: Secondary | ICD-10-CM

## 2022-11-01 DIAGNOSIS — K5900 Constipation, unspecified: Secondary | ICD-10-CM | POA: Diagnosis not present

## 2022-11-01 DIAGNOSIS — K219 Gastro-esophageal reflux disease without esophagitis: Secondary | ICD-10-CM | POA: Diagnosis not present

## 2022-11-01 DIAGNOSIS — R142 Eructation: Secondary | ICD-10-CM | POA: Diagnosis not present

## 2022-11-01 MED ORDER — PANTOPRAZOLE SODIUM 40 MG PO TBEC: 40.0000 mg | DELAYED_RELEASE_TABLET | Freq: Every day | ORAL | 1 refills | Status: DC

## 2022-11-01 NOTE — Progress Notes (Signed)
Referring Provider: Suzan Slick, MD Primary Care Physician:  Suzan Slick, MD Primary GI Physician: Dr. Levon Hedger   Chief Complaint  Patient presents with   Gastroesophageal Reflux    Follow up on GERD. Would like to discuss coming off of omeprazole. States her stomach pain is gone and occasionally has some reflux. Still having cough and burping.    Constipation    Follow up on constipation. Takes miralax as needed but not taking much at all.    HPI:   Lindsey Hardy is a 82 y.o. female with past medical history of neuropathy, glaucoma, HTN, IBS-C, HLD,   Patient presenting today for follow up of belching and constipatin  Last seen July 2023, at that time feelling better on omeprazole 40mg  daily. Belching improved. Less early satiety. Regular BMs on miralax.  Recommended to continue PPI daily, miralax daily, diaphragmatic breathing  Present:  Patient states she is having more belching and still has coughing. She feels belching has come back over the past few months, though she notes she has cut back on her omeprazole on her own, occasionally skipping her dose of PPI. She feels that PPI tends to make her more gassy at times. No nausea or vomiting. Coughing can occur sometimes right after eating. She notes some hoarseness at times as well. Does not feel coughing is worse at night. Has occasional sore throat. She has had only 1-2 episodes of heartburn. She notes globus sensation often. She did inquire about safety of remaining on PPI therapy long term.   still has some constipation, but not doing miralax very often. Notes she can have up to 3 BMs per day, denies looser stools to diarrhea. Still having to strain some. Trying to drink a good amount of water.  Getting plenty of fruits and veggies in her diet.   Last EGD:  06/30/2021 presence of few erosions in her stomach with negative biopsies for H. pylori or dysplasia. Normal esophagus and duodenum. She was prescribed omeprazole  40 mg every day since then  Last Colonoscopy:10/2012 Normal mucosa of terminal. Single small diverticulum at ascending colon. External hemorrhoids. No evidence of colonic polyps or stricture. Suspect her symptoms are secondary to IBS.  Past Medical History:  Diagnosis Date   Arthritis    GERD (gastroesophageal reflux disease)    High cholesterol    Hypertension    Past Surgical History:  Procedure Laterality Date   Back fusion 2012 L4 and 5.     BIOPSY  06/30/2021   Procedure: BIOPSY;  Surgeon: Dolores Frame, MD;  [facility];  Service: Gastroenterology;;   COLONOSCOPY     2007 normal   COLONOSCOPY N/A 10/31/2012   Procedure: COLONOSCOPY;  Surgeon: Malissa Hippo, MD;  [facility];  Service: Endoscopy;  Laterality: N/A;  730   ESOPHAGOGASTRODUODENOSCOPY (EGD) WITH PROPOFOL N/A 06/30/2021   Procedure: ESOPHAGOGASTRODUODENOSCOPY (EGD) WITH PROPOFOL;  Surgeon: Dolores Frame, MD;  [facility];  Service: Gastroenterology;  Laterality: N/A;  1100   EYE SURGERY Left    cataract removal   Current Outpatient Medications  Medication Sig Dispense Refill   amLODipine (NORVASC) 5 MG tablet Take 5 mg by mouth daily.     Calcium Carbonate-Vitamin D (CALCIUM 600+D PO) Take 1 tablet by mouth in the morning and at bedtime.     cyanocobalamin 1000 MCG tablet Take 1,000 mcg by mouth in the morning and at bedtime.     ibandronate (BONIVA) 150  MG tablet Take 150 mg by mouth every 30 (thirty) days.     Magnesium 250 MG TABS Take 250 mg by mouth daily at 6 (six) AM.     omeprazole (PRILOSEC) 40 MG capsule Take 1 capsule (40 mg total) by mouth daily. 90 capsule 3   polyethylene glycol (MIRALAX / GLYCOLAX) 17 g packet Take 17 g by mouth daily.     thiamine (VITAMIN B-1) 100 MG tablet Take 100 mg by mouth daily.     No current facility-administered medications for this visit.    Allergies as of 11/01/2022 - Review Complete 11/01/2022   Allergen Reaction Noted   Penicillins Itching, Swelling, and Rash 04/02/2010    No family history on file.  Social History   Socioeconomic History   Marital status: Married    Spouse name: Not on file   Number of children: Not on file   Years of education: Not on file   Highest education level: Not on file  Occupational History   Not on file  Tobacco Use   Smoking status: Never   Smokeless tobacco: Never  Vaping Use   Vaping status: Never Used  Substance and Sexual Activity   Alcohol use: Yes    Alcohol/week: 0.0 standard drinks of alcohol    Comment: glass wine occasionally   Drug use: No   Sexual activity: Not on file  Other Topics Concern   Not on file  Social History Narrative   Not on file   Social Determinants of Health   Financial Resource Strain: Not on file  Food Insecurity: Not on file  Transportation Needs: Not on file  Physical Activity: Not on file  Stress: Not on file  Social Connections: Not on file   Review of systems General: negative for malaise, night sweats, fever, chills, weight loss Neck: Negative for lumps, goiter, pain and significant neck swelling Resp: Negative for cough, wheezing, dyspnea at rest CV: Negative for chest pain, leg swelling, palpitations, orthopnea GI: denies melena, hematochezia, nausea, vomiting, diarrhea, constipation, dysphagia, odyonophagia, early satiety or unintentional weight loss. +GERD symptoms +constipation +belching  MSK: Negative for joint pain or swelling, back pain, and muscle pain. Derm: Negative for itching or rash Psych: Denies depression, anxiety, memory loss, confusion. No homicidal or suicidal ideation.  Heme: Negative for prolonged bleeding, bruising easily, and swollen nodes. Endocrine: Negative for cold or heat intolerance, polyuria, polydipsia and goiter. Neuro: negative for tremor, gait imbalance, syncope and seizures. The remainder of the review of systems is noncontributory.  Physical  Exam: There were no vitals taken for this visit. General:   Alert and oriented. No distress noted. Pleasant and cooperative.  Head:  Normocephalic and atraumatic. Eyes:  Conjuctiva clear without scleral icterus. Mouth:  Oral mucosa pink and moist. Good dentition. No lesions. Heart: Normal rate and rhythm, s1 and s2 heart sounds present.  Lungs: Clear lung sounds in all lobes. Respirations equal and unlabored. Abdomen:  +BS, soft, non-tender and non-distended. No rebound or guarding. No HSM or masses noted. Derm: No palmar erythema or jaundice Msk:  Symmetrical without gross deformities. Normal posture. Extremities:  Without edema. Neurologic:  Alert and  oriented x4 Psych:  Alert and cooperative. Normal mood and affect.  Invalid input(s): "6 MONTHS"   ASSESSMENT: Lindsey Hardy is a 82 y.o. female presenting today for follow up of belching and constipation  Constipation: doing okay, nothing occasionally having to strain, not taking miralax very often. Encouraged her to Increase water intake, aim for atleast  64 oz per day, Increase fruits, veggies and whole grains, kiwi and prunes are especially good for constipation and try doing 1 capful miralax every other day to every 2 days to help keep her regular and from straining  Belching/GERD: patient noting some belching still, coughing after eating, globus sensation, some hoarseness, symptoms are certainly indicative of GERD/silent reflux.  Given she is still having the symptoms on omeprazole, would recommend changing PPI therapy to pantoprazole 40 mg once daily.  She needs to implement good reflux precautions, I encouraged her to raise the head of her bed at night as well. Patient was re-assured regarding PPI use and safety of when appropriate indications in question. Most recent studies on PPI therapy that association of symptoms is not equivalent to causation and overall association with for example osteoporosis is weak and based on  observational studies. When PPI use is indicated, it is safe to proceed with therapy and titrate dosing/use based on symptom response.  Last colonoscopy in September 2014, without polyps.  No family history of CRC.  I did discuss repeat screening colonoscopy with the patient as she would be due now, however at this time she does not wish to undergo any further screening colonoscopies which I think is reasonable given her age.  She will let me know if she changes her mind about this.  PLAN:  Good reflux precautions  2. Stop omeprazole  3. Start protonix 40mg  daily  4. Pt to make me aware if she wishes to pursue further colonoscopy 5. Miralax 1 capful every other day to every 2 days  6. Increase water intake, aim for atleast 64 oz per day Increase fruits, veggies and whole grains, kiwi and prunes are especially good for constipation  All questions were answered, patient verbalized understanding and is in agreement with plan as outlined above.    Follow Up: 6 months   Lindsey Renier L. Jeanmarie Hubert, MSN, APRN, AGNP-C Adult-Gerontology Nurse Practitioner [facility]  I have reviewed the note and agree with the APP's assessment as described in this progress note  Lindsey Blazing, MD Gastroenterology and Hepatology [facility]enter[facility]

## 2022-11-01 NOTE — Patient Instructions (Signed)
Avoid greasy, spicy, fried, citrus foods, and be mindful that caffeine, carbonated drinks, chocolate and alcohol can increase reflux symptoms Stay upright 2-3 hours after eating, prior to lying down and avoid eating late in the evenings. Stop omeprazole  Start protonix 40mg  daily, 30 minutes prior to breakfast  Please make me aware if you wish to pursue further colonoscopy Can try doing Miralax 1 capful every other day to every 2 days  Increase water intake, aim for atleast 64 oz per day Increase fruits, veggies and whole grains, kiwi and prunes are especially good for constipation  Follow up 4 months  It was a pleasure to see you today. I want to create trusting relationships with patients and provide genuine, compassionate, and quality care. I truly value your feedback! please be on the lookout for a survey regarding your visit with me today. I appreciate your input about our visit and your time in completing this!    Lindsey Hardy L. Jeanmarie Hubert, MSN, APRN, AGNP-C Adult-Gerontology Nurse Practitioner [facility] Gastroenterology at [facility]

## 2023-03-03 ENCOUNTER — Ambulatory Visit (INDEPENDENT_AMBULATORY_CARE_PROVIDER_SITE_OTHER): Payer: Medicare PPO | Admitting: Gastroenterology

## 2023-03-03 ENCOUNTER — Encounter (INDEPENDENT_AMBULATORY_CARE_PROVIDER_SITE_OTHER): Payer: Self-pay | Admitting: Gastroenterology

## 2023-03-03 VITALS — BP 138/76 | HR 84 | Temp 98.4°F | Ht 59.0 in | Wt 112.4 lb

## 2023-03-03 DIAGNOSIS — R053 Chronic cough: Secondary | ICD-10-CM | POA: Diagnosis not present

## 2023-03-03 DIAGNOSIS — R142 Eructation: Secondary | ICD-10-CM

## 2023-03-03 DIAGNOSIS — K5900 Constipation, unspecified: Secondary | ICD-10-CM | POA: Diagnosis not present

## 2023-03-03 DIAGNOSIS — K219 Gastro-esophageal reflux disease without esophagitis: Secondary | ICD-10-CM

## 2023-03-03 NOTE — Patient Instructions (Signed)
-  continue with miralax, would try taking this every other day  Increase water intake, aim for atleast 64 oz per day Increase fruits, veggies and whole grains, kiwi and prunes are especially good for constipation -you can try stopping omeprazole if you do not feel this is helping your symptoms, if you have worsening of GERD symptoms/cough off of this, I would resume it -you can try over the counter IB gard to help with gas/flatulence  As you prefer to follow up on as needed basis, please give [place] a call for any new or worsening GI issues

## 2023-03-03 NOTE — Progress Notes (Addendum)
Referring Provider: Suzan Slick, MD Primary Care Physician:  No primary care provider on file. Primary GI Physician: Dr. Levon Hedger   Chief Complaint  Patient presents with   Gastroesophageal Reflux    Follow up on GERD. Still has cough she has had for about 2 years. Not taking protonix that is on med list but she is taking omeprazole that was not on med list.    Constipation    Follow up on IBS with constipation. States doing ok with constipation. Takes miralax as needed.    HPI:   Lindsey Hardy is a 83 y.o. female with past medical history of neuropathy, glaucoma, HTN, IBS-C, HLD, GERD  Patient presenting today for follow up of GERD/belching and constipation  Last seen September 2024, at that time patient reported more belching and chronic cough.  Patient had cut back on her omeprazole occasionally skipping her dose as she felt PPI tend to make her more gassy.  Symptom cough encouraged after eating.  Notes some hoarseness at times.  Globus sensation at times.  Still having some constipation, doing MiraLAX as needed.  Patient recommended to continue good reflux precautions, stop omeprazole, start Protonix 40 mg daily, MiraLAX 1 capful every other day to every 2 days, good water intake, diet high in fruits, veggies, whole grains  Present: States she is feeling better. She notes that when she began protonix she felt that she was having some palpitations so she finished 2 month course and then stopped this and symptoms resolved. Stayed off of PPI for a while and then decided to resume her omeprazole and is taking this daily, notes no real improvement in cough or hoarseness with this. Feels that even off of medication she was not having frequent GERD symptoms. She does not feel that symptoms are worse when she wakes up in the morning, unable to determine if it is worse after eating.She notes more belching and flatulence and does not feel that omeprazole makes much difference. She is not  taking anything else for her gas. She denies bloating.  Has some mild dysphagia on occasion that she thinks is worse with certain foods.   Usually has a BM almost daily. She uses miralax on occasion and sometimes has to use her hand to put pressure on her rectum to help her defecate. Notes some hemorrhoids she can feel coming out at times. She is unsure if miralax is helping much as she is not taking this very often. She denies any melena, has occasional toilet tissue hematochezia that she reports happens on and off for the past few years, has not had any bleeding recently. No weight loss or appetite changes   Last EGD:  06/30/2021 presence of few erosions in her stomach with negative biopsies for H. pylori or dysplasia. Normal esophagus and duodenum.  Last Colonoscopy:10/2012 Normal mucosa of terminal. Single small diverticulum at ascending colon. External hemorrhoids. No evidence of colonic polyps or stricture. Suspect her symptoms are secondary to IBS.  (Pt declined to undergo any further colonoscopy as of 2024)  Past Medical History:  Diagnosis Date   Arthritis    GERD (gastroesophageal reflux disease)    High cholesterol    Hypertension     Past Surgical History:  Procedure Laterality Date   Back fusion 2012 L4 and 5.     BIOPSY  06/30/2021   Procedure: BIOPSY;  Surgeon: Dolores Frame, MD;  [facility];  Service: Gastroenterology;;   COLONOSCOPY  2007 normal   COLONOSCOPY N/A 10/31/2012   Procedure: COLONOSCOPY;  Surgeon: Malissa Hippo, MD;  [facility];  Service: Endoscopy;  Laterality: N/A;  730   ESOPHAGOGASTRODUODENOSCOPY (EGD) WITH PROPOFOL N/A 06/30/2021   Procedure: ESOPHAGOGASTRODUODENOSCOPY (EGD) WITH PROPOFOL;  Surgeon: Dolores Frame, MD;  [facility];  Service: Gastroenterology;  Laterality: N/A;  1100   EYE SURGERY Left    cataract removal    Current Outpatient Medications  Medication Sig Dispense  Refill   amLODipine (NORVASC) 5 MG tablet Take 5 mg by mouth daily.     Calcium Carbonate-Vitamin D (CALCIUM 600+D PO) Take 1 tablet by mouth in the morning and at bedtime.     cyanocobalamin 1000 MCG tablet Take 1,000 mcg by mouth in the morning and at bedtime.     Magnesium 250 MG TABS Take 250 mg by mouth daily at 6 (six) AM.     omeprazole (PRILOSEC) 40 MG capsule Take 1 capsule by mouth daily.     polyethylene glycol (MIRALAX / GLYCOLAX) 17 g packet Take 17 g by mouth daily as needed.     pregabalin (LYRICA) 25 MG capsule Take 25 mg by mouth. Takes 2 bid     thiamine (VITAMIN B-1) 100 MG tablet Take 100 mg by mouth daily.     pantoprazole (PROTONIX) 40 MG tablet Take 1 tablet (40 mg total) by mouth daily. (Patient not taking: Reported on 03/03/2023) 30 tablet 1   No current facility-administered medications for this visit.    Allergies as of 03/03/2023 - Review Complete 03/03/2023  Allergen Reaction Noted   Penicillins Itching, Swelling, and Rash 04/02/2010    No family history on file.  Social History   Socioeconomic History   Marital status: Married    Spouse name: Not on file   Number of children: Not on file   Years of education: Not on file   Highest education level: Not on file  Occupational History   Not on file  Tobacco Use   Smoking status: Never   Smokeless tobacco: Never  Vaping Use   Vaping status: Never Used  Substance and Sexual Activity   Alcohol use: Yes    Alcohol/week: 0.0 standard drinks of alcohol    Comment: glass wine occasionally   Drug use: No   Sexual activity: Not on file  Other Topics Concern   Not on file  Social History Narrative   Not on file   Social Drivers of Health   Financial Resource Strain: Low Risk  (02/21/2023)   [facility]   Overall Financial Resource Strain (CARDIA)    Difficulty of Paying Living Expenses: Not hard at all  Food Insecurity: No Food Insecurity (02/21/2023)   [facility]    Hunger Vital Sign    Worried About Running Out of Food in the Last Year: Never true    Ran Out of Food in the Last Year: Never true  Transportation Needs: No Transportation Needs (02/21/2023)   Received from [facility] - Transportation    Lack of Transportation (Medical): No    Lack of Transportation (Non-Medical): No  Physical Activity: Insufficiently Active (02/21/2023)   Received from [facility]   Exercise Vital Sign    Days of Exercise per Week: 5 days    Minutes of Exercise per Session: 20 min  Stress: Stress Concern Present (02/21/2023)   Received from [facility]   [place]  [employer] - Occupational Stress Questionnaire    Feeling of Stress : To some extent  Social Connections: Not on file    Review of systems General: negative for malaise, night sweats, fever, chills, weight loss Neck: Negative for lumps, goiter, pain and significant neck swelling Resp: Negative for cough, wheezing, dyspnea at rest CV: Negative for chest pain, leg swelling, palpitations, orthopnea GI: denies melena, hematochezia, nausea, vomiting, diarrhea, odyonophagia, early satiety or unintentional weight loss. +constipation +belching +flatulence +occasional dysphagia  The remainder of the review of systems is noncontributory.  Physical Exam: There were no vitals taken for this visit. General:   Alert and oriented. No distress noted. Pleasant and cooperative.  Head:  Normocephalic and atraumatic. Eyes:  Conjuctiva clear without scleral icterus. Mouth:  Oral mucosa pink and moist. Good dentition. No lesions. Heart: Normal rate and rhythm, s1 and s2 heart sounds present.  Lungs: Clear lung sounds in all lobes. Respirations equal and unlabored. Abdomen:  +BS, soft, non-tender and non-distended. No rebound or guarding. No HSM or masses noted. Derm: No palmar erythema or jaundice Msk:  Symmetrical without gross deformities. Normal posture. Extremities:  Without  edema. Neurologic:  Alert and  oriented x4 Psych:  Alert and cooperative. Normal mood and affect.  Invalid input(s): "6 MONTHS"   ASSESSMENT: RORI GOAR is a 83 y.o. female presenting today for follow up of constipation and GERD/belching  Constipation: Having a BM usually daily though sometimes has to apply pressure to her rectum to help as stools are harder.  She takes MiraLAX as needed, thinks she may need to try taking on a more regular basis as she is not sure if this helps at all.  Would recommend she try taking MiraLAX at least every other day to see how she does with this, she can increase to daily dosing if every other day is not sufficient in helping to soften her bowel movements.  GERD/Belching/cough: Patient did not tolerate Protonix she will to give her palpitations so she stopped the medication.  Did not note improvement in belching or coughing while on this.  She denies any bloating or early satiety.  No nausea or vomiting.  She has rare GERD symptoms, she is currently taking omeprazole but has not noticed much improvement in her symptoms with this either.  We had discussed diaphragmatic breathing in the past as well as being mindful of her diet.  At this time it is unclear the etiology of her cough though as she has been on 2 different PPIs does not seem that this has improved therefore I think is less likely that her cough is secondary to reflux.  She would like to try stopping her omeprazole to see how she feels which I think is reasonable, if she notices worsening of her symptoms she can resume PPI therapy.  Discussed that she can try over-the-counter IBgard for gas and belching.  At this time patient feels that she is overall doing well, does not feel she needs further intervention or routine follow up with [place], she wishes to follow up on as needed basis and will reach out if she has any issues for which she feels she needs to be seen by [place] for.    PLAN:  -continue with  miralax, would try every other day  Increase water intake, aim for atleast 64 oz per day Increase fruits, veggies and whole grains, kiwi and prunes are especially good for constipation -IB gard for gas/flatulence  - can try  stopping PPI, should resume if GERD/cough worsens  All questions were answered, patient verbalized understanding and is in agreement with plan as outlined above.   Follow Up: PRN  Kaidon Kinker L. Jeanmarie Hubert, MSN, APRN, AGNP-C Adult-Gerontology Nurse Practitioner [facility]  I have reviewed the note and agree with the APP's assessment as described in this progress note  [name], MD Gastroenterology and Hepatology [facility] Gastroenterology

## 2023-03-16 ENCOUNTER — Ambulatory Visit (INDEPENDENT_AMBULATORY_CARE_PROVIDER_SITE_OTHER): Payer: Medicare PPO | Admitting: Orthopaedic Surgery

## 2023-03-16 ENCOUNTER — Encounter: Payer: Self-pay | Admitting: Orthopaedic Surgery

## 2023-03-16 VITALS — Ht 59.0 in | Wt 112.0 lb

## 2023-03-16 DIAGNOSIS — Z981 Arthrodesis status: Secondary | ICD-10-CM

## 2023-03-16 DIAGNOSIS — M47816 Spondylosis without myelopathy or radiculopathy, lumbar region: Secondary | ICD-10-CM | POA: Diagnosis not present

## 2023-03-16 NOTE — Progress Notes (Signed)
 Office Visit Note   Patient: Lindsey Hardy           Date of Birth: 19-Feb-1940           MRN: [id number] Visit Date: 03/16/2023              Requested by: Carleen Dallas PARAS, FNP [address] [place],  [place] [id number] PCP: No primary care provider on file.   Assessment & Plan: Visit Diagnoses:  1. History of lumbar fusion   2. Spondylosis without myelopathy or radiculopathy, lumbar region     Plan: Patient has solid fusion L5-S1 with adjacent level degenerative changes at L3-4 but does not have claudication symptoms at this time.  She gets progressive radicular or symptoms of stenosis with claudication she could return for diagnostic imaging.  X-rays reviewed and discussed.  Follow-Up Instructions: No follow-ups on file.   Orders:  No orders of the defined types were placed in this encounter.  No orders of the defined types were placed in this encounter.     Procedures: No procedures performed   Clinical Data: No additional findings.   Subjective: Chief Complaint  Patient presents with   Lower Back - Pain    HPI 83 year old female new patient with a history of lumbar fusion 2012 by Dr. Leeann.  She has a history of lumbar injections in the past and states the last injection worked for several years but now she has had gradual increased discomfort.  She notices some numbness at times but does not have classic claudication symptoms.  She does get relief with sitting but is not limited in how long she can stand or walk.  Bending turning twisting aggravates her.  Recent x-rays showed instrumented fusion L4-5 where she had previous spondylolisthesis.  She has collapse at L3-4 with 2 mm disc space and endplate spurring with facet arthropathy.  Review of Systems all systems noncontributory to HPI.   Objective: Vital Signs: Ht 4' 11 (1.499 m)   Wt 112 lb (50.8 kg)   BMI 22.62 kg/m   Physical Exam Constitutional:      Appearance: She is  well-developed.  HENT:     Head: Normocephalic.     Right Ear: External ear normal.     Left Ear: External ear normal. There is no impacted cerumen.  Eyes:     Pupils: Pupils are equal, round, and reactive to light.  Neck:     Thyroid: No thyromegaly.     Trachea: No tracheal deviation.  Cardiovascular:     Rate and Rhythm: Normal rate.  Pulmonary:     Effort: Pulmonary effort is normal.  Abdominal:     Palpations: Abdomen is soft.  Musculoskeletal:     Cervical back: No rigidity.  Skin:    General: Skin is warm and dry.  Neurological:     Mental Status: She is alert and oriented to person, place, and time.  Psychiatric:        Behavior: Behavior normal.     Ortho Exam negative straight leg raising.  Quads hamstrings hip range of motion is symmetrical normal reflexes.  No hip flexion weakness.  Well-healed lumbar incision.  Specialty Comments:  No specialty comments available.  Imaging: No results found.   PMFS History: Patient Active Problem List   Diagnosis Date Noted   History of lumbar fusion 03/17/2023   Spondylosis without myelopathy or radiculopathy, lumbar region 03/17/2023   Constipation 03/03/2023   Gastroesophageal  reflux disease without esophagitis 11/01/2022   Gastritis and gastroduodenitis 08/24/2021   IBS (irritable bowel syndrome) 05/25/2021   Burping 05/25/2021   Chronic cough 05/25/2021   Past Medical History:  Diagnosis Date   Arthritis    GERD (gastroesophageal reflux disease)    High cholesterol    Hypertension     No family history on file.  Past Surgical History:  Procedure Laterality Date   Back fusion 2012 L4 and 5.     BIOPSY  06/30/2021   Procedure: BIOPSY;  Surgeon: Eartha Angelia Sieving, MD;  [facility];  Service: Gastroenterology;;   COLONOSCOPY     2007 normal   COLONOSCOPY N/A 10/31/2012   Procedure: COLONOSCOPY;  Surgeon: Claudis RAYMOND Rivet, MD;  [facility];  Service: Endoscopy;  Laterality:  N/A;  730   ESOPHAGOGASTRODUODENOSCOPY (EGD) WITH PROPOFOL  N/A 06/30/2021   Procedure: ESOPHAGOGASTRODUODENOSCOPY (EGD) WITH PROPOFOL ;  Surgeon: Eartha Angelia Sieving, MD;  [facility];  Service: Gastroenterology;  Laterality: N/A;  1100   EYE SURGERY Left    cataract removal   Social History   Occupational History   Not on file  Tobacco Use   Smoking status: Never   Smokeless tobacco: Never  Vaping Use   Vaping status: Never Used  Substance and Sexual Activity   Alcohol use: Yes    Alcohol/week: 0.0 standard drinks of alcohol    Comment: glass wine occasionally   Drug use: No   Sexual activity: Not on file

## 2023-03-17 DIAGNOSIS — Z981 Arthrodesis status: Secondary | ICD-10-CM | POA: Insufficient documentation

## 2023-03-17 DIAGNOSIS — M47816 Spondylosis without myelopathy or radiculopathy, lumbar region: Secondary | ICD-10-CM | POA: Insufficient documentation

## 2023-11-23 ENCOUNTER — Encounter (INDEPENDENT_AMBULATORY_CARE_PROVIDER_SITE_OTHER): Payer: Self-pay | Admitting: Gastroenterology

## 2023-12-12 ENCOUNTER — Encounter: Payer: Self-pay | Admitting: Radiology

## 2024-01-23 IMAGING — DX DG CHEST 2V
2 series · 2 of 2 positions shown · non-contrast
Comparison: Report from chest radiograph 10/10/2019, images
unavailable

CLINICAL DATA: Cough for more than 1 year.

EXAM:
CHEST - 2 VIEW

[chest pa]
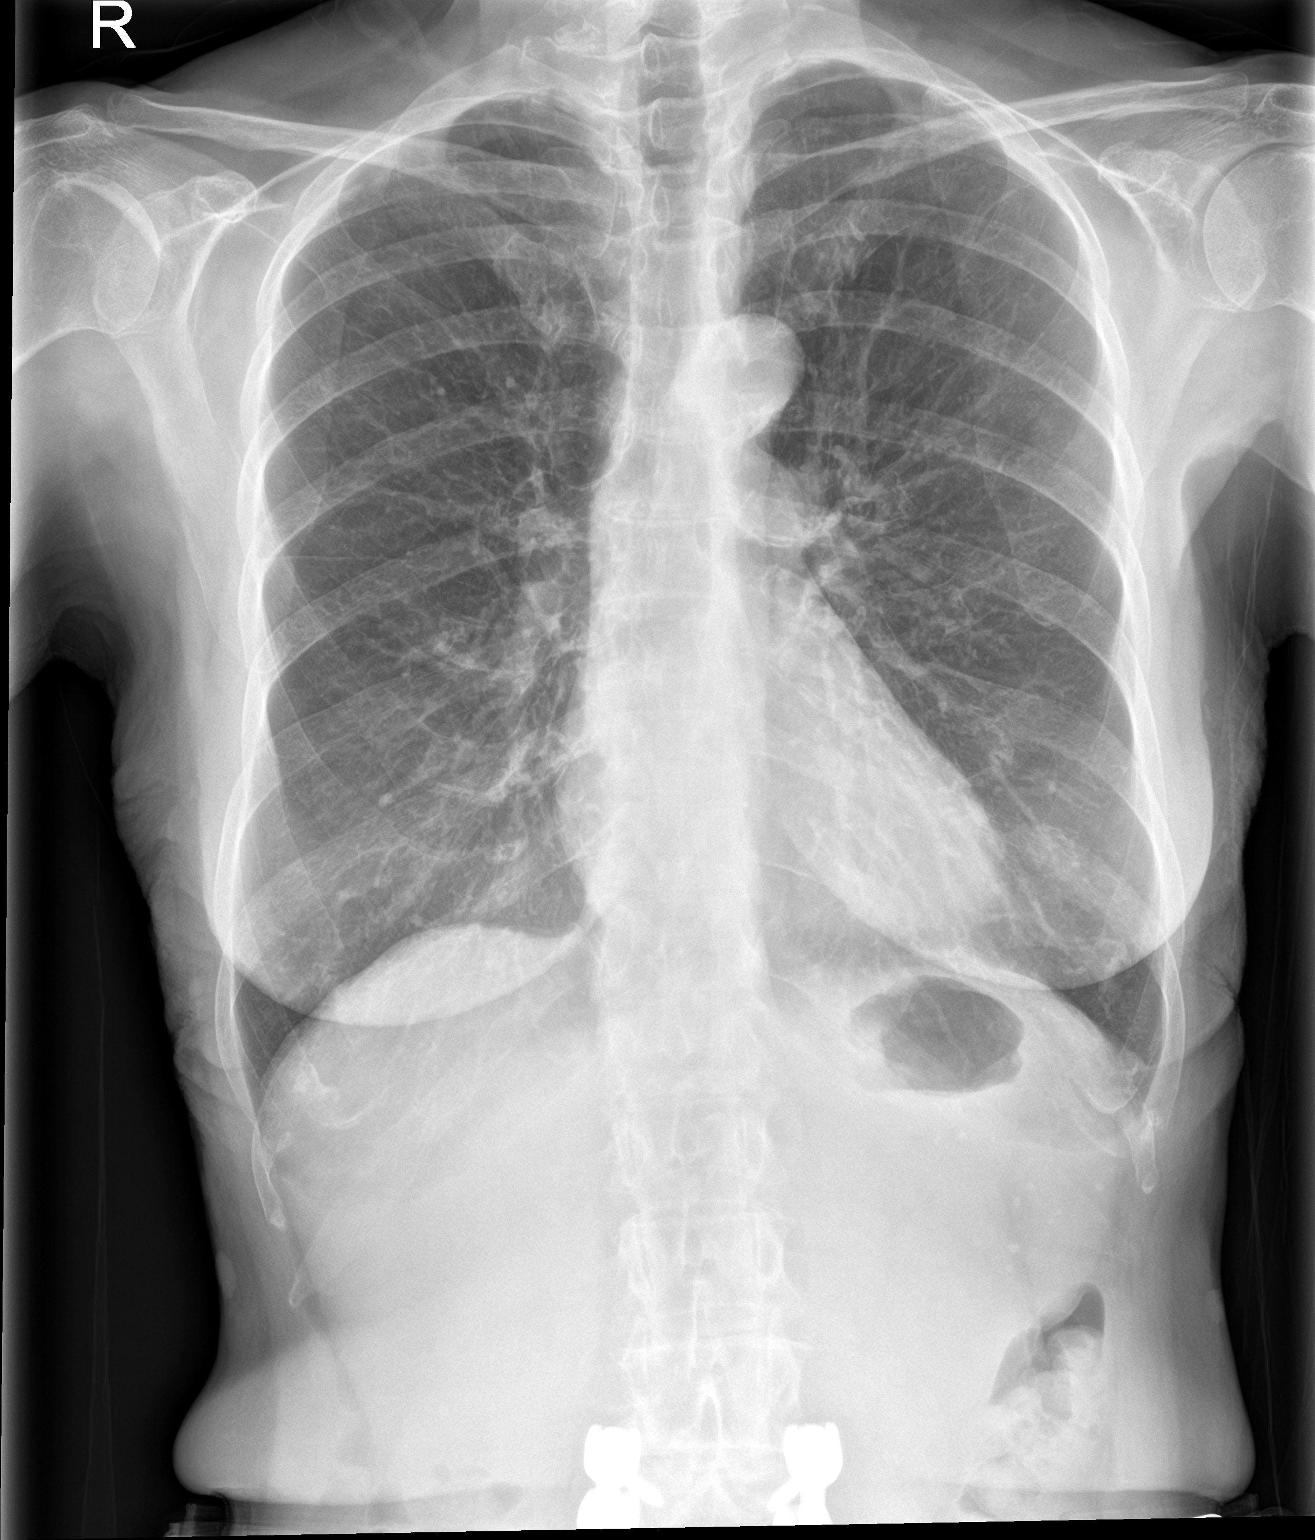

[chest lat]
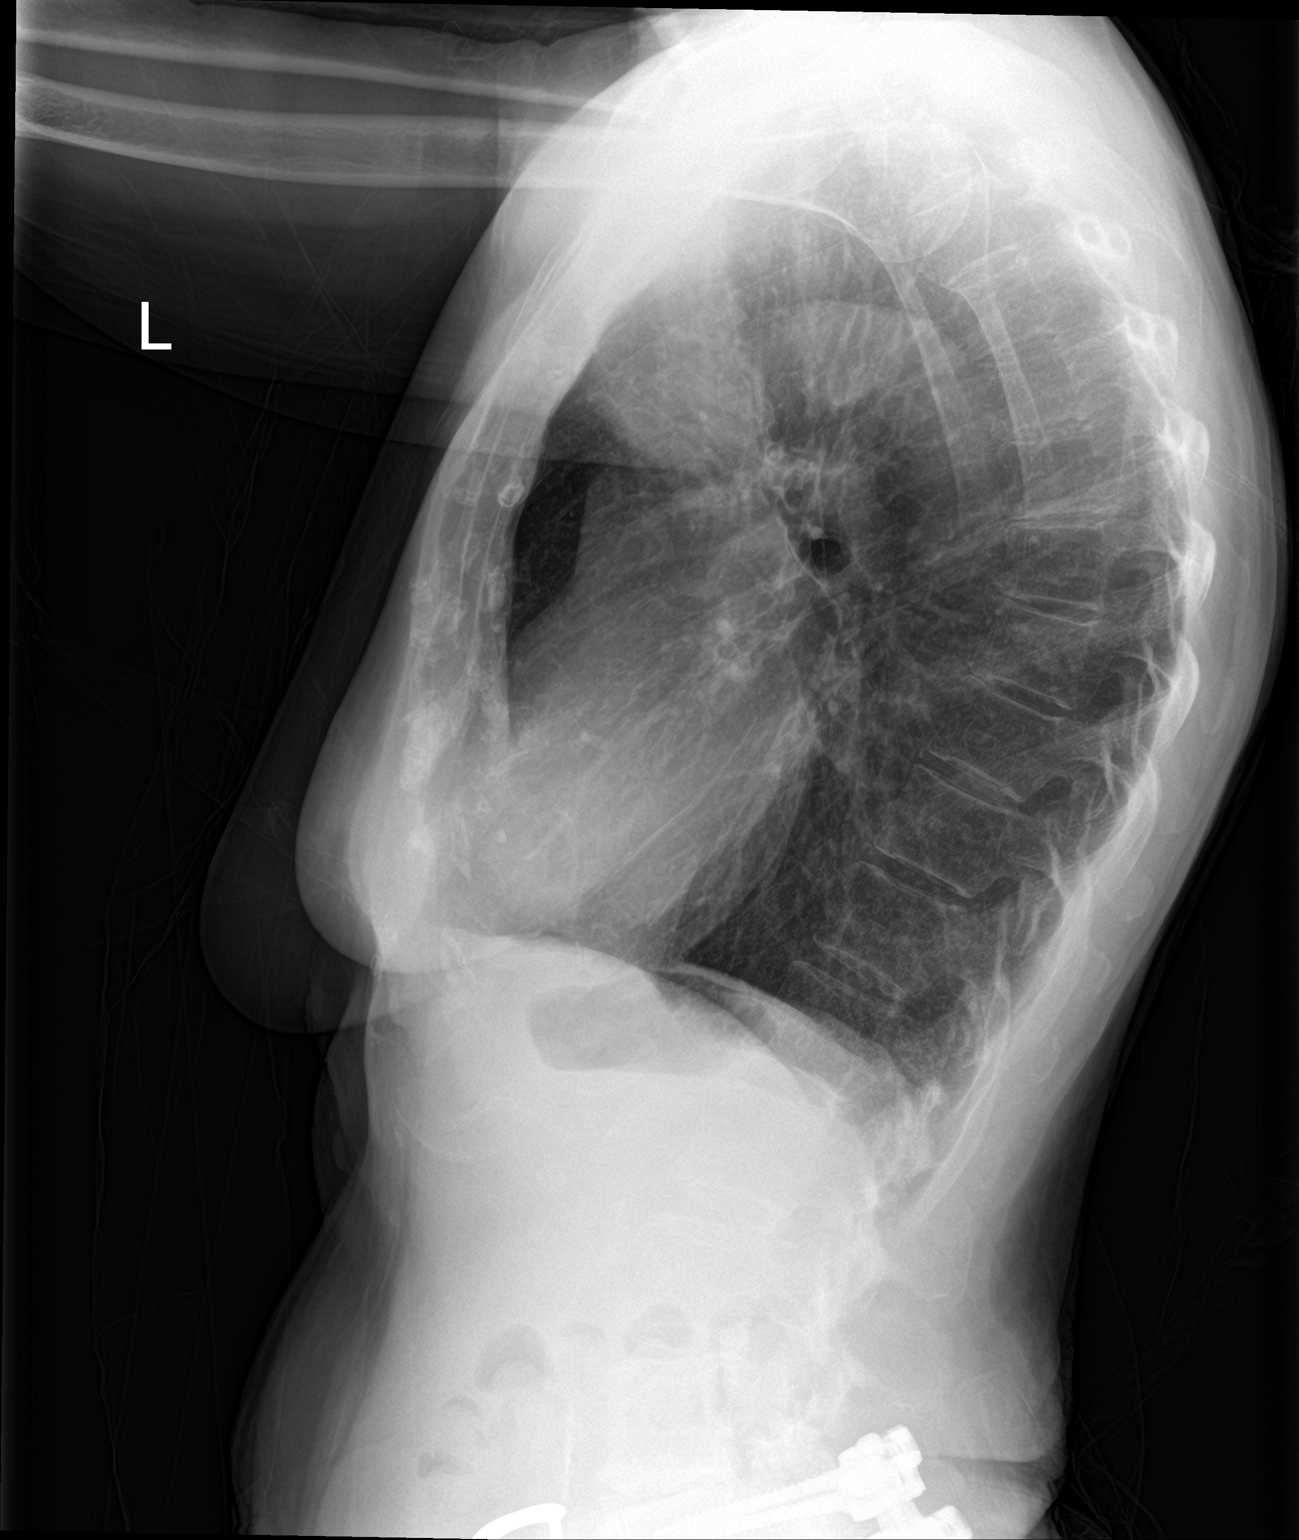

[2 of 2 positions shown; findings below may reference images not displayed]

FINDINGS: The heart is normal in size. Normal mediastinal contours, mild
aortic atherosclerosis. No focal airspace disease. No pulmonary
edema, pleural effusion, or pneumothorax. No visualized pulmonary
nodule or mass. No acute osseous findings.
IMPRESSION: 1. No acute chest findings or explanation for cough.
2. Mild aortic atherosclerosis.
